# Patient Record
Sex: Male | Born: 1950
Health system: Southern US, Community
[De-identification: ages and names within clinical notes are randomized; demographics above are authoritative.]

## PROBLEM LIST (undated history)

## (undated) DIAGNOSIS — R7989 Other specified abnormal findings of blood chemistry: Secondary | ICD-10-CM

## (undated) DIAGNOSIS — Z86010 Personal history of colonic polyps: Secondary | ICD-10-CM

## (undated) DIAGNOSIS — Z973 Presence of spectacles and contact lenses: Secondary | ICD-10-CM

## (undated) DIAGNOSIS — K635 Polyp of colon: Secondary | ICD-10-CM

## (undated) DIAGNOSIS — M1990 Unspecified osteoarthritis, unspecified site: Secondary | ICD-10-CM

## (undated) DIAGNOSIS — I2510 Atherosclerotic heart disease of native coronary artery without angina pectoris: Secondary | ICD-10-CM

## (undated) DIAGNOSIS — I10 Essential (primary) hypertension: Secondary | ICD-10-CM

## (undated) HISTORY — DX: Essential (primary) hypertension: I10

## (undated) HISTORY — PX: COLONOSCOPY: SHX174

## (undated) HISTORY — DX: Polyp of colon: K63.5

## (undated) HISTORY — DX: Personal history of colonic polyps: Z86.010

---

## 1983-01-14 HISTORY — PX: VARIOCOCELE REPAIR: SHX242

## 2004-04-09 ENCOUNTER — Encounter: Admission: RE | Admit: 2004-04-09 | Discharge: 2004-04-09 | Payer: Self-pay | Admitting: Cardiology

## 2004-05-13 ENCOUNTER — Ambulatory Visit: Payer: Self-pay | Admitting: *Deleted

## 2004-08-13 ENCOUNTER — Ambulatory Visit: Payer: Self-pay | Admitting: Gastroenterology

## 2004-08-13 ENCOUNTER — Encounter (INDEPENDENT_AMBULATORY_CARE_PROVIDER_SITE_OTHER): Payer: Self-pay | Admitting: Specialist

## 2004-08-13 DIAGNOSIS — Z860100 Personal history of colon polyps, unspecified: Secondary | ICD-10-CM

## 2004-08-13 DIAGNOSIS — K449 Diaphragmatic hernia without obstruction or gangrene: Secondary | ICD-10-CM | POA: Insufficient documentation

## 2004-08-13 HISTORY — DX: Personal history of colon polyps, unspecified: Z86.0100

## 2004-08-13 HISTORY — DX: Personal history of colonic polyps: Z86.010

## 2005-03-05 ENCOUNTER — Ambulatory Visit: Payer: Self-pay | Admitting: Cardiology

## 2005-10-24 ENCOUNTER — Ambulatory Visit: Payer: Self-pay | Admitting: Cardiology

## 2005-12-05 ENCOUNTER — Ambulatory Visit: Payer: Self-pay | Admitting: Cardiology

## 2006-01-30 ENCOUNTER — Ambulatory Visit (HOSPITAL_COMMUNITY): Admission: RE | Admit: 2006-01-30 | Discharge: 2006-01-30 | Payer: Self-pay | Admitting: Neurosurgery

## 2006-03-05 ENCOUNTER — Ambulatory Visit: Payer: Self-pay | Admitting: *Deleted

## 2006-03-05 LAB — CONVERTED CEMR LAB: PSA: 2.75 ng/mL (ref 0.10–4.00)

## 2006-09-22 ENCOUNTER — Ambulatory Visit: Payer: Self-pay | Admitting: Cardiology

## 2006-09-22 LAB — CONVERTED CEMR LAB
BUN: 12 mg/dL (ref 6–23)
CO2: 28 meq/L (ref 19–32)
Calcium: 9.3 mg/dL (ref 8.4–10.5)
Chloride: 104 meq/L (ref 96–112)
Cholesterol: 151 mg/dL (ref 0–200)
Creatinine, Ser: 1.1 mg/dL (ref 0.4–1.5)
GFR calc Af Amer: 90 mL/min
GFR calc non Af Amer: 74 mL/min
Glucose, Bld: 88 mg/dL (ref 70–99)
HDL: 40.6 mg/dL (ref >39.0)
LDL Cholesterol: 78 mg/dL (ref 0–99)
PSA: 5.12 ng/mL — ABNORMAL HIGH (ref 0.10–4.00)
Potassium: 3.9 meq/L (ref 3.5–5.1)
Sodium: 138 meq/L (ref 135–145)
Total CHOL/HDL Ratio: 3.7
Triglycerides: 163 mg/dL — ABNORMAL HIGH (ref 0–149)
VLDL: 33 mg/dL (ref 0–40)

## 2006-09-24 ENCOUNTER — Ambulatory Visit: Payer: Self-pay | Admitting: Cardiovascular Disease

## 2006-09-24 ENCOUNTER — Ambulatory Visit (HOSPITAL_COMMUNITY): Admission: RE | Admit: 2006-09-24 | Discharge: 2006-09-24 | Payer: Self-pay | Admitting: Cardiovascular Disease

## 2007-09-01 ENCOUNTER — Telehealth: Payer: Self-pay | Admitting: Internal Medicine

## 2007-09-08 ENCOUNTER — Encounter (INDEPENDENT_AMBULATORY_CARE_PROVIDER_SITE_OTHER): Payer: Self-pay | Admitting: Gastroenterology

## 2007-09-10 ENCOUNTER — Ambulatory Visit: Payer: Self-pay | Admitting: Internal Medicine

## 2007-09-10 DIAGNOSIS — K625 Hemorrhage of anus and rectum: Secondary | ICD-10-CM | POA: Insufficient documentation

## 2007-09-10 DIAGNOSIS — I10 Essential (primary) hypertension: Secondary | ICD-10-CM | POA: Insufficient documentation

## 2007-09-10 DIAGNOSIS — K648 Other hemorrhoids: Secondary | ICD-10-CM | POA: Insufficient documentation

## 2008-01-14 HISTORY — PX: PROSTATE SURGERY: SHX751

## 2008-05-29 ENCOUNTER — Telehealth: Payer: Self-pay | Admitting: Internal Medicine

## 2008-07-19 ENCOUNTER — Encounter (INDEPENDENT_AMBULATORY_CARE_PROVIDER_SITE_OTHER): Payer: Self-pay | Admitting: *Deleted

## 2009-02-02 LAB — HM COLONOSCOPY

## 2009-03-13 ENCOUNTER — Telehealth (INDEPENDENT_AMBULATORY_CARE_PROVIDER_SITE_OTHER): Payer: Self-pay

## 2009-04-27 ENCOUNTER — Ambulatory Visit: Payer: Self-pay | Admitting: Cardiology

## 2009-04-27 DIAGNOSIS — Z87898 Personal history of other specified conditions: Secondary | ICD-10-CM | POA: Insufficient documentation

## 2009-04-27 LAB — CONVERTED CEMR LAB
BUN: 14 mg/dL (ref 6–23)
Basophils Absolute: 0 10*3/uL (ref 0.0–0.1)
Basophils Relative: 1 % (ref 0–1)
CO2: 23 meq/L (ref 19–32)
Calcium: 9.3 mg/dL (ref 8.4–10.5)
Chloride: 103 meq/L (ref 96–112)
Creatinine, Ser: 1 mg/dL (ref 0.40–1.50)
Eosinophils Absolute: 0.1 10*3/uL (ref 0.0–0.7)
Eosinophils Relative: 3 % (ref 0–5)
Glucose, Bld: 91 mg/dL (ref 70–99)
HCT: 40.2 % (ref 39.0–52.0)
Hemoglobin: 13.9 g/dL (ref 13.0–17.0)
Lymphocytes Relative: 51 % — ABNORMAL HIGH (ref 12–46)
Lymphs Abs: 2.6 10*3/uL (ref 0.7–4.0)
MCHC: 34.6 g/dL (ref 30.0–36.0)
MCV: 91 fL (ref 78.0–100.0)
Monocytes Absolute: 0.5 10*3/uL (ref 0.1–1.0)
Monocytes Relative: 10 % (ref 3–12)
Neutro Abs: 1.8 10*3/uL (ref 1.7–7.7)
Neutrophils Relative %: 36 % — ABNORMAL LOW (ref 43–77)
PSA: 4.05 ng/mL — ABNORMAL HIGH (ref 0.10–4.00)
Platelets: 184 10*3/uL (ref 150–400)
Potassium: 4.4 meq/L (ref 3.5–5.3)
RBC: 4.42 M/uL (ref 4.22–5.81)
RDW: 12.5 % (ref 11.5–15.5)
Sodium: 139 meq/L (ref 135–145)
WBC: 5 10*3/uL (ref 4.0–10.5)

## 2009-07-24 ENCOUNTER — Encounter (INDEPENDENT_AMBULATORY_CARE_PROVIDER_SITE_OTHER): Payer: Self-pay

## 2009-07-24 ENCOUNTER — Encounter: Payer: Self-pay | Admitting: Internal Medicine

## 2009-08-07 ENCOUNTER — Ambulatory Visit: Payer: Self-pay | Admitting: Internal Medicine

## 2009-08-09 ENCOUNTER — Encounter: Payer: Self-pay | Admitting: Internal Medicine

## 2010-02-14 NOTE — Progress Notes (Signed)
Summary: triage  Phone Note Call from Patient Call back at C [phone]   Caller: Patient/ Shane Perkins  Call For: Shane Perkins Reason for Call: Talk to Nurse Details for Reason: TRIAGE Summary of Call: Shane Perkins has spoke with Shane Shane Perkins re: bloody diarrhea and needs to come in for an OV pt is on vacation next friday and the  following Mon. can he be worked into sch? Initial call taken by: Guadlupe [facility],  September 01, 2007 2:15 PM  Follow-up for Phone Call        pt requests appt on a Friday due to cath lab schedule 09-10-07 1:30 Follow-up by: Darcey Nora RN,  September 01, 2007 2:37 PM

## 2010-02-14 NOTE — Progress Notes (Signed)
Summary: Schedule recall colon   Phone Note Outgoing Call Call back at [facility] Phone [phone]   Call placed by: Darcey Nora RN, CGRN,  March 13, 2009 2:47 PM Call placed to: Patient Summary of Call: I have left messages for the patient asking him to call me to schedule a colonoscopy if he is interested in scheduling.  I have had no return calls.  A follow up letter will be sent. Initial call taken by: Darcey Nora RN, CGRN,  March 13, 2009 2:48 PM

## 2010-02-14 NOTE — Letter (Signed)
Summary: Recall Colonoscopy Letter  [facility]  [address] [place], [place] [address]   Phone: [phone]  Fax: [phone]      July 19, 2008 MRN: [id number]   Shane Perkins [address] [place], [place]  [address]   Dear Shane Perkins,   According to your medical record, it is time for you to schedule a Colonoscopy. The American Cancer Society recommends this procedure as a method to detect early colon cancer. Patients with a family history of colon cancer, or a personal history of colon polyps or inflammatory bowel disease are at increased risk.  This letter has beeen generated based on the recommendations made at the time of your procedure. If you feel that in your particular situation this may no longer apply, please contact our office.  Please call our office at [phone] to schedule this appointment or to update your records at your earliest convenience.  Thank you for cooperating with [place] to provide you with the very best care possible.   Sincerely,  Iva Boop, M.D.  [facility] [phone]

## 2010-02-14 NOTE — Letter (Signed)
Summary: [facility] Instructions  [facility]  [address] [place], [place] [address]   Phone: [phone]  Fax: [phone]       Shane Perkins    Aug 28, 1950    MRN: [id number]        Procedure Day /Date: Tuesday 08/07/09     Arrival Time: 2:30       Procedure Time: 3:30     Location of Procedure:                    _X _  [facility] (4th Floor)   PREPARATION FOR COLONOSCOPY WITH MOVIPREP   Starting 5 days prior to your procedure 08/02/09 do not eat nuts, seeds, popcorn, corn, beans, peas,  salads, or any raw vegetables.  Do not take any fiber supplements (e.g. Metamucil, Citrucel, and Benefiber).  THE DAY BEFORE YOUR PROCEDURE         DATE: 08/06/09  DAY: Monday  1.  Drink clear liquids the entire day-NO SOLID FOOD  2.  Do not drink anything colored red or purple.  Avoid juices with pulp.  No orange juice.  3.  Drink at least 64 oz. (8 glasses) of fluid/clear liquids during the day to prevent dehydration and help the prep work efficiently.  CLEAR LIQUIDS INCLUDE: Water Jello Ice Popsicles Tea (sugar ok, no milk/cream) Powdered fruit flavored drinks Coffee (sugar ok, no milk/cream) Gatorade Juice: apple, white grape, white cranberry  Lemonade Clear bullion, consomm, broth Carbonated beverages (any kind) Strained chicken noodle soup Hard Candy                             4.  In the morning, mix first dose of MoviPrep solution:    Empty 1 Pouch A and 1 Pouch B into the disposable container    Add lukewarm drinking water to the top line of the container. Mix to dissolve    Refrigerate (mixed solution should be used within 24 hrs)  5.  Begin drinking the prep at 5:00 p.m. The MoviPrep container is divided by 4 marks.   Every 15 minutes drink the solution down to the next mark (approximately 8 oz) until the full liter is complete.   6.  Follow completed prep with 16 oz of clear liquid of your choice (Nothing red or purple).  Continue  to drink clear liquids until bedtime.  7.  Before going to bed, mix second dose of MoviPrep solution:    Empty 1 Pouch A and 1 Pouch B into the disposable container    Add lukewarm drinking water to the top line of the container. Mix to dissolve    Refrigerate  THE DAY OF YOUR PROCEDURE      DATE: 08/07/09 DAY: Tuesday  Beginning at 11:30 a.m. (5 hours before procedure):         1. Every 15 minutes, drink the solution down to the next mark (approx 8 oz) until the full liter is complete.  2. Follow completed prep with 16 oz. of clear liquid of your choice.    3. You may drink clear liquids until 1:30  (2 HOURS BEFORE PROCEDURE).   MEDICATION INSTRUCTIONS  Unless otherwise instructed, you should take regular prescription medications with a small sip of water   as early as possible the morning of your procedure.  Diabetic patients - see separate instructions.  Stop taking Plavix or Aggrenox on  _  _  (  7 days before procedure).     Stop taking Coumadin on  _ _  (5 days before procedure).  Additional medication instructions: _         OTHER INSTRUCTIONS  You will need a responsible adult at least 58 years of age to accompany you and drive you home.   This person must remain in the waiting room during your procedure.  Wear loose fitting clothing that is easily removed.  Leave jewelry and other valuables at home.  However, you may wish to bring a book to read or  an iPod/MP3 player to listen to music as you wait for your procedure to start.  Remove all body piercing jewelry and leave at home.  Total time from sign-in until discharge is approximately 2-3 hours.  You should go home directly after your procedure and rest.  You can resume normal activities the  day after your procedure.  The day of your procedure you should not:   Drive   Make legal decisions   Operate machinery   Drink alcohol   Return to work  You will receive specific instructions about  eating, activities and medications before you leave.    The above instructions have been reviewed and explained to me by   Darcey Nora, RN    I fully understand and can verbalize these instructions _____________________________ Date _________

## 2010-02-14 NOTE — Letter (Signed)
Summary: Patient Notice- Polyp Results  [facility]  [address] [place], [place] [address]   Phone: [phone]  Fax: [phone]        August 09, 2009 MRN: [id number]    Shane Perkins [address] [place], [place]  [address]    Dear Shane Perkins,  As we discussed on the phoone, the polyp removed from your colon was adenomatous. This means that it was pre-cancerous or that  it had the potential to change into cancer over time. The other small polyp was not recovered but based upon size and appearance, was benign.  I recommend that you have a repeat colonoscopy in 5 years to determine if you have developed any new polyps over time. If you develop any new rectal bleeding, abdominal pain or significant bowel habit changes, please contact us before then.  Please call us if you are having persistent problems or have questions about your condition that have not been fully answered at this time.   Sincerely,  Iva Boop MD, [facility]  This letter has been electronically signed by your physician.  Appended Document: Patient Notice- Polyp Results Letter sent 8.1.2011

## 2010-02-14 NOTE — Procedures (Signed)
Summary: ENDO 08-13-2004   EGD  Procedure date:  08/13/2004  Findings:      Location: [facility]     Patient Name: Shane Perkins, Shane Perkins MRN:  Procedure Procedures: Panendoscopy (EGD) CPT: 43235.  Personnel: Endoscopist: Ulyess Mort, MD.  Exam Location: Exam performed in Outpatient Clinic. Outpatient  Patient Consent: Procedure, Alternatives, Risks and Benefits discussed, consent obtained, from patient. Consent was obtained by the RN.  Indications Symptoms: Reflux symptoms  History  Current Medications: Patient is not currently taking Coumadin.  Pre-Exam Physical: Entire physical exam was normal.  Exam Exam Info: Maximum depth of insertion Duodenum, intended Duodenum. Patient position: on left side. Vocal cords visualized. Gastric retroflexion performed. Images were not taken. ASA Classification: II. Tolerance: good.  Sedation Meds: Patient assessed and found to be appropriate for moderate (conscious) sedation. Fentanyl 50 mcg. given IV. Versed 6 mg. given IV. Cetacaine Spray 2 sprays given aerosolized.  Monitoring: BP and pulse monitoring done. Oximetry used. Supplemental O2 given  Findings - HIATAL HERNIA: 2 cms. in length. ICD9: Hernia, Hiatal: 553.3. Comments: mild esophagitis.  - MUCOSAL ABNORMALITY: Fundus to Antrum. Erythematous mucosa. Granular mucosa. Edema present. RUT done, results pending.  - MUCOSAL ABNORMALITY: Pyloric Sphincter to Jejunum. Granular mucosa.   Assessment Abnormal examination, see findings above.  Diagnoses: 553.3: Hernia, Hiatal.   Events  Unplanned Intervention: No unplanned interventions were required.  Unplanned Events: There were no complications. Plans Medication(s): Await pathology. Continue current medications. PPI: Aciphex 20 mg QAM,   Patient Education: Patient given standard instructions for: Hiatal Hernia. Reflux. Mucosal Abnormality.  Disposition: After procedure patient sent to recovery.  After recovery patient sent home.   This report was created from the original endoscopy report, which was reviewed and signed by the above listed endoscopist.

## 2010-02-14 NOTE — Procedures (Signed)
Summary: Colonoscopy  Patient: Shane Perkins Note: All result statuses are Final unless otherwise noted.  Tests: (1) Colonoscopy (COL)   COL Colonoscopy           DONE     [facility]     [address]     [place], [place]  [address]           COLONOSCOPY PROCEDURE REPORT           PATIENT:  Shane, Perkins  MR#:  [id number]     BIRTHDATE:  1950-10-01, 59 yrs. old  GENDER:  male     ENDOSCOPIST:  Iva Boop, MD, [facility]           PROCEDURE DATE:  08/07/2009     PROCEDURE:  Colonoscopy with biopsy and snare polypectomy     ASA CLASS:  Class II     INDICATIONS:  surveillance and high-risk screening 8 mm adenoma     removed 8/06     MEDICATIONS:   Fentanyl 50 mcg IV, Versed 5 mg IV           DESCRIPTION OF PROCEDURE:   After the risks benefits and     alternatives of the procedure were thoroughly explained, informed     consent was obtained.  Digital rectal exam was performed and     revealed no abnormalities and normal prostate.   The LB CF-[id number]     [id number] endoscope was introduced through the anus and advanced to     the cecum, which was identified by both the appendix and ileocecal     valve, without limitations.  The quality of the prep was     excellent, using MoviPrep.  The instrument was then slowly     withdrawn as the colon was fully examined.     Insertion: 8:46 minutes Withdrawal: 12:55 minutes     <<PROCEDUREIMAGES>>           FINDINGS:  Two polyps were found. 3-4 mm transverse colon polyp     and a 2-3 mm right colon polyp. The transverse polyp was snared     without cautery. Retrieval was unsuccessful. The right colon     polyp was removed using cold biopsy forceps.  This was otherwise a     normal examination of the colon.   Retroflexed views in the rectum     revealed no abnormalities.    The scope was then withdrawn from     the patient and the procedure completed.           COMPLICATIONS:  None     ENDOSCOPIC IMPRESSION:     1) Two diminutive  polyps removed, one recovered.     2) Otherwise normal examination, excellent prep     3) Personal history of 8mm adenoma removal 8/206.           REPEAT EXAM:  In for Colonoscopy, pending biopsy results.           Iva Boop, MD, [facility]           CC:  The Patient           n.     eSIGNED:   Iva Boop at 08/07/2009 04:12 PM           Shawnie Pons, [id number]  Note: An exclamation mark (!) indicates a result that was not dispersed into the flowsheet. Document Creation Date: 08/07/2009 4:12 PM _______________________________________________________________________  (1) Order result status:  Final Collection or observation date-time: 08/07/2009 16:02 Requested date-time:  Receipt date-time:  Reported date-time:  Referring Physician:   Ordering Physician: Stan Head [id number]) Specimen Source:  Source: Launa Grill Order Number: [id number] Lab site:   Appended Document: Colonoscopy     Procedures Next Due Date:    Colonoscopy: 07/2014  Appended Document: Colonoscopy     Procedures Next Due Date:    Colonoscopy: 08/2014

## 2010-02-14 NOTE — Miscellaneous (Signed)
Summary: Moviprep  Clinical Lists Changes  Medications: Added new medication of MOVIPREP 100 GM SOLR (PEG-KCL-NACL-NASULF-NA ASC-C) per written instructions - Signed Rx of MOVIPREP 100 GM SOLR (PEG-KCL-NACL-NASULF-NA ASC-C) per written instructions;  #1 x 0;  Signed;  Entered by: Darcey Nora RN, CGRN;  Authorized by: Iva Boop MD, [facility];  Method used: Faxed to [facility], [place] [address]  [place], [place], [place], [place]  [address], Ph: [phone], Fax: [phone]    Prescriptions: MOVIPREP 100 GM SOLR (PEG-KCL-NACL-NASULF-NA ASC-C) per written instructions  #1 x 0   Entered by:   Darcey Nora RN, CGRN   Authorized by:   Iva Boop MD, [facility]   Signed by:   Darcey Nora RN, CGRN on 07/24/2009   Method used:   Faxed to ...       [facility] (retail)       [phone]  [place], [place]  [address]       Ph: [phone]       Fax: [phone]   RxID:   [id number]   Appended Document: Moviprep    Clinical Lists Changes  Medications: Rx of MOVIPREP 100 GM SOLR (PEG-KCL-NACL-NASULF-NA ASC-C) per written instructions;  #1 x 0;  Signed;  Entered by: Darcey Nora RN, CGRN;  Authorized by: Iva Boop MD, [facility];  Method used: Electronically to [facility] [address], [address] Shipping/mailing, [place], [place]  [address], Ph: [phone], Fax: [phone]    Prescriptions: MOVIPREP 100 GM SOLR (PEG-KCL-NACL-NASULF-NA ASC-C) per written instructions  #1 x 0   Entered by:   Darcey Nora RN, CGRN   Authorized by:   Iva Boop MD, [facility]   Signed by:   Darcey Nora RN, CGRN on 07/24/2009   Method used:   Electronically to        [facility]* (retail)       [address]       [address]mailing       [place], [place]  [address]       Ph: [phone]       Fax: [phone]   RxID:   [id number]

## 2010-02-14 NOTE — Procedures (Signed)
Summary: COLON 08-13-04   Colonoscopy  Procedure date:  08/13/2004  Findings:      Location:  [facility].    Procedures Next Due Date:    Colonoscopy: 08/2008    Patient Name: Shane, Perkins MRN:  Procedure Procedures: Colonoscopy CPT: [address].  Personnel: Endoscopist: Ulyess Mort, MD.  Exam Location: Exam performed in Outpatient Clinic. Outpatient  Patient Consent: Procedure, Alternatives, Risks and Benefits discussed, consent obtained, from patient. Consent was obtained by the RN.  Indications  Increased Risk Screening: For family history of colorectal neoplasia, in  uncles Family History of Polyps.  History  Current Medications: Patient is not currently taking Coumadin.  Pre-Exam Physical: Entire physical exam was normal.  Exam Exam: Extent of exam reached: Cecum, extent intended: Cecum.  The cecum was identified by appendiceal orifice and IC valve. Colon retroflexion performed. Images were not taken. ASA Classification: II. Tolerance: excellent.  Monitoring: Pulse and BP monitoring, Oximetry used. Supplemental O2 given.  Colon Prep Prep results: good.  Sedation Meds: Patient assessed and found to be appropriate for moderate (conscious) sedation. Fentanyl 100 mcg. given IV. Versed 10 mg. given IV.  Findings POLYP: Transverse Colon, Maximum size: 8 mm. sessile polyp. Procedure:  snare with cautery, removed, retrieved, Polyp sent to pathology. ICD9: Colon Polyps: 211.3.  - NOT SEEN ON EXAM: Cecum to Rectum. AVM's, Colitis, Crohn's, Diverticulosis,   Assessment Abnormal examination, see findings above.  Diagnoses: 211.3: Colon Polyps.   Events  Unplanned Interventions: No intervention was required.  Unplanned Events: There were no complications. Plans Medication Plan: Await pathology. Continue current medications.  Patient Education: Patient given standard instructions for: Polyps. Yearly hemoccult testing recommended.  Patient instructed to get routine colonoscopy every 4 years.  Disposition: After procedure patient sent to recovery. After recovery patient sent home.   This report was created from the original endoscopy report, which was reviewed and signed by the above listed endoscopist.

## 2010-02-14 NOTE — Assessment & Plan Note (Signed)
Summary: Blood in stool   History of Present Illness Visit Type: self referral Primary GI MD: Stan Head MD [facility] Primary Eaden Hettinger: Willa Rough, M.D. 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 there has been no recuurence and there was no change in bowel habits. He has also checked his stools for hemoccult since and they have been negative on more than one occasion. Prior colonoscopy in 2006 found an 8 mm tubular adenoma that was removed and was otherwise normal. He can fell swelling in the anal area at times and in the past has had some bright red blood on toilet paper after frequent wiping.  Other GI symptoms are heartburn, helped significantly by daily PPI and exacerbated by coffee, wine and bagels.  He also has problems with pelvic pains, mainly aches, and right inguinal pressure and/or pain sensations with ? of a mild bulge there at times. 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 Only rare sciatica, there is some low back pain associated at times. Rest helps relieve these symptoms.  All other GI ROS negative.              Prior Medications Reviewed Using: Patient Recall  Updated Prior Medication List: ATACAND 16 MG TABS (CANDESARTAN CILEXETIL) 1 tablet by mouth once daily COREG 6.25 MG TABS (CARVEDILOL) 1 tablet by mouth once daily OMEPRAZOLE 20 MG CPDR (OMEPRAZOLE) 1 capsule by mouth once daily  Current Allergies (reviewed today): No known allergies   Past Medical History:    Reviewed history from 09/08/2007 and no changes required:       Hypertension       Adenomatous colon polyp 2006       Helicobacter pylori infection (treated 2006)  Past Surgical History:    varicocele repair 1984   Family History:    Family History of Colon Cancer: 2 uncles  paternal    Family History of Colon  Polyps: brother  Social History:    Patient has never smoked.     Alcohol Use - no    Daily Caffeine Use    Married with children    interventional Cardiologist, [facility]   Risk Factors:  Tobacco use:  never Caffeine use:  3 drinks per day Alcohol use:  no   Review of Systems       All other ROS negative except as per HPI.    Vital Signs:  Patient Profile:   60 Years Old Male Height:     71 inches Weight:      204 pounds BMI:     28.56 BSA:     2.13 Pulse rate:   58 / minute BP sitting:   162 / 94  (left arm)  Vitals Entered By: Milford Cage CMA (September 10, 2007 1:47 PM)                  Physical Exam  General:     Well developed, well nourished, no acute distress. Abdomen:     nontender, no masses, no herniae Genitalia:     Normal uncircumcised penia, testicles and scrotum. No herniae. Additional Exam:     ANOSCOPY: small internal hemorrhoids in anal canal    Impression & Recommendations:  Problem # 1:  RECTAL BLEEDING (ICD-569.3) Assessment: Comment Only Self-limited, small hemorrhoid seen on anoscopy,  I think this was likely cause of bleeding. Will monitor for recurrence and plan on routine colonoscopy next year as per Dr. Blossom Hoops prior recommendation for the single 8 mm adenoma he had. if further bleeding or if he were anemic would perform colonoscopy sooner. CBC will be checked via cardiology lab.  Problem # 2:  HYPERTENSION (ICD-401.9) Assessment: Comment Only CMET and lipid panel for health maintenamce, via cardiology lab  Problem # 3:  HEMORRHOIDS, INTERNAL (ICD-455.0) Assessment: Comment Only see #1  Problem # 4:  COLONIC POLYPS, ADENOMATOUS, HX OF (ICD-V12.72) Assessment: Comment Only     ]  Appended Document: Blood in stool Rectal exam with normal tone, brown stool, no mass and normal prostate.

## 2010-02-14 NOTE — Progress Notes (Signed)
Summary: Change in bowels   Phone Note Call from Patient   Caller: Patient Summary of Call: Having some changes in bowels with smaller caliber stools 787.99 hx of adenomatous polyp v12.72 he will call Darcey Nora, RN to arrange for colonoscopy to evaluate change in bowels After that consider further eval of groin/pelvic pain Initial call taken by: Iva Boop MD,  May 29, 2008 9:04 PM  Follow-up for Phone Call        I have sent a flag with availabilities to Dr Riley Kill through EMR.  He will call or flag me back when he is ready to schedule. Follow-up by: Darcey Nora RN,  May 30, 2008 10:12 AM

## 2010-05-06 ENCOUNTER — Other Ambulatory Visit: Payer: Self-pay | Admitting: Cardiology

## 2010-05-06 ENCOUNTER — Other Ambulatory Visit (INDEPENDENT_AMBULATORY_CARE_PROVIDER_SITE_OTHER): Payer: 59 | Admitting: *Deleted

## 2010-05-06 DIAGNOSIS — Z0389 Encounter for observation for other suspected diseases and conditions ruled out: Secondary | ICD-10-CM

## 2010-05-06 DIAGNOSIS — N320 Bladder-neck obstruction: Secondary | ICD-10-CM

## 2010-05-06 DIAGNOSIS — I10 Essential (primary) hypertension: Secondary | ICD-10-CM

## 2010-05-06 LAB — CBC WITH DIFFERENTIAL/PLATELET
Basophils Absolute: 0 10*3/uL (ref 0.0–0.1)
Basophils Relative: 0.5 % (ref 0.0–3.0)
Eosinophils Absolute: 0.1 10*3/uL (ref 0.0–0.7)
Eosinophils Relative: 2.1 % (ref 0.0–5.0)
HCT: 39.5 % (ref 39.0–52.0)
Hemoglobin: 13.9 g/dL (ref 13.0–17.0)
Lymphocytes Relative: 43.2 % (ref 12.0–46.0)
Lymphs Abs: 2.6 10*3/uL (ref 0.7–4.0)
MCHC: 35.2 g/dL (ref 30.0–36.0)
MCV: 95.5 fl (ref 78.0–100.0)
Monocytes Absolute: 0.5 10*3/uL (ref 0.1–1.0)
Monocytes Relative: 8.8 % (ref 3.0–12.0)
Neutro Abs: 2.7 10*3/uL (ref 1.4–7.7)
Neutrophils Relative %: 45.4 % (ref 43.0–77.0)
Platelets: 203 10*3/uL (ref 150.0–400.0)
RBC: 4.14 Mil/uL — ABNORMAL LOW (ref 4.22–5.81)
RDW: 12.4 % (ref 11.5–14.6)
WBC: 6 10*3/uL (ref 4.5–10.5)

## 2010-05-06 LAB — URINALYSIS, ROUTINE W REFLEX MICROSCOPIC
Bilirubin Urine: NEGATIVE
Hgb urine dipstick: NEGATIVE
Ketones, ur: NEGATIVE
Nitrite: NEGATIVE
Specific Gravity, Urine: <=1.005 (ref 1.000–1.030)
Total Protein, Urine: NEGATIVE
Urine Glucose: NEGATIVE
Urobilinogen, UA: 0.2 (ref 0.0–1.0)
pH: 6 (ref 5.0–8.0)

## 2010-05-06 LAB — BASIC METABOLIC PANEL
BUN: 14 mg/dL (ref 6–23)
CO2: 27 mEq/L (ref 19–32)
Calcium: 8.9 mg/dL (ref 8.4–10.5)
Chloride: 94 mEq/L — ABNORMAL LOW (ref 96–112)
Creatinine, Ser: 1 mg/dL (ref 0.4–1.5)
GFR: 81.55 mL/min (ref >60.00)
Glucose, Bld: 86 mg/dL (ref 70–99)
Potassium: 3.8 mEq/L (ref 3.5–5.1)
Sodium: 134 mEq/L — ABNORMAL LOW (ref 135–145)

## 2010-05-06 LAB — HEPATIC FUNCTION PANEL
ALT: 17 U/L (ref 0–53)
AST: 21 U/L (ref 0–37)
Albumin: 4.1 g/dL (ref 3.5–5.2)
Alkaline Phosphatase: 46 U/L (ref 39–117)
Bilirubin, Direct: 0.1 mg/dL (ref 0.0–0.3)
Total Bilirubin: 0.2 mg/dL — ABNORMAL LOW (ref 0.3–1.2)
Total Protein: 6.5 g/dL (ref 6.0–8.3)

## 2010-05-06 LAB — PSA: PSA: 6.98 ng/mL — ABNORMAL HIGH (ref 0.10–4.00)

## 2010-08-30 ENCOUNTER — Other Ambulatory Visit: Payer: Self-pay | Admitting: *Deleted

## 2010-08-30 MED ORDER — COREG 6.25 MG PO TABS: 6.2500 mg | ORAL_TABLET | Freq: Two times a day (BID) | ORAL | Status: DC

## 2010-08-30 NOTE — Telephone Encounter (Signed)
rx sent to [facility] today for Dr.Odette. Danielle Rankin

## 2011-04-24 ENCOUNTER — Other Ambulatory Visit: Payer: Self-pay

## 2011-04-24 ENCOUNTER — Other Ambulatory Visit (INDEPENDENT_AMBULATORY_CARE_PROVIDER_SITE_OTHER): Payer: 59

## 2011-04-24 DIAGNOSIS — N400 Enlarged prostate without lower urinary tract symptoms: Secondary | ICD-10-CM

## 2011-04-24 DIAGNOSIS — I10 Essential (primary) hypertension: Secondary | ICD-10-CM

## 2011-04-25 LAB — BASIC METABOLIC PANEL
BUN: 15 mg/dL (ref 6–23)
CO2: 27 mEq/L (ref 19–32)
Calcium: 9.2 mg/dL (ref 8.4–10.5)
Chloride: 103 mEq/L (ref 96–112)
Creatinine, Ser: 0.9 mg/dL (ref 0.4–1.5)
GFR: 91.79 mL/min (ref >60.00)
Glucose, Bld: 83 mg/dL (ref 70–99)
Potassium: 4.3 mEq/L (ref 3.5–5.1)
Sodium: 138 mEq/L (ref 135–145)

## 2011-04-25 LAB — PSA: PSA: 5.06 ng/mL — ABNORMAL HIGH (ref 0.10–4.00)

## 2011-09-09 ENCOUNTER — Other Ambulatory Visit: Payer: Self-pay | Admitting: Cardiovascular Disease

## 2011-10-16 ENCOUNTER — Other Ambulatory Visit: Payer: Self-pay | Admitting: Dermatology

## 2012-03-12 ENCOUNTER — Other Ambulatory Visit: Payer: Self-pay

## 2012-03-12 MED ORDER — PANTOPRAZOLE SODIUM 40 MG PO TBEC: 40.0000 mg | DELAYED_RELEASE_TABLET | Freq: Every day | ORAL | Status: DC

## 2012-04-08 ENCOUNTER — Other Ambulatory Visit: Payer: Self-pay | Admitting: Internal Medicine

## 2012-04-08 LAB — TSH: TSH: 1.6 u[IU]/mL (ref 0.35–5.50)

## 2012-04-08 LAB — LIPID PANEL
Cholesterol: 157 mg/dL (ref 0–200)
HDL: 38.9 mg/dL — ABNORMAL LOW (ref >39.00)
LDL Cholesterol: 103 mg/dL — ABNORMAL HIGH (ref 0–99)
Total CHOL/HDL Ratio: 4
Triglycerides: 78 mg/dL (ref 0.0–149.0)
VLDL: 15.6 mg/dL (ref 0.0–40.0)

## 2012-04-08 LAB — CBC WITH DIFFERENTIAL/PLATELET
Basophils Absolute: 0 10*3/uL (ref 0.0–0.1)
Basophils Relative: 0.8 % (ref 0.0–3.0)
Eosinophils Absolute: 0.1 10*3/uL (ref 0.0–0.7)
Eosinophils Relative: 1.8 % (ref 0.0–5.0)
HCT: 41.3 % (ref 39.0–52.0)
Hemoglobin: 14.2 g/dL (ref 13.0–17.0)
Lymphocytes Relative: 44.3 % (ref 12.0–46.0)
Lymphs Abs: 1.7 10*3/uL (ref 0.7–4.0)
MCHC: 34.3 g/dL (ref 30.0–36.0)
MCV: 94.4 fl (ref 78.0–100.0)
Monocytes Absolute: 0.4 10*3/uL (ref 0.1–1.0)
Monocytes Relative: 10.3 % (ref 3.0–12.0)
Neutro Abs: 1.7 10*3/uL (ref 1.4–7.7)
Neutrophils Relative %: 42.8 % — ABNORMAL LOW (ref 43.0–77.0)
Platelets: 190 10*3/uL (ref 150.0–400.0)
RBC: 4.38 Mil/uL (ref 4.22–5.81)
RDW: 12.7 % (ref 11.5–14.6)
WBC: 3.9 10*3/uL — ABNORMAL LOW (ref 4.5–10.5)

## 2012-04-08 LAB — COMPREHENSIVE METABOLIC PANEL
ALT: 18 U/L (ref 0–53)
AST: 20 U/L (ref 0–37)
Albumin: 4.2 g/dL (ref 3.5–5.2)
Alkaline Phosphatase: 40 U/L (ref 39–117)
BUN: 17 mg/dL (ref 6–23)
CO2: 25 mEq/L (ref 19–32)
Calcium: 8.9 mg/dL (ref 8.4–10.5)
Chloride: 98 mEq/L (ref 96–112)
Creatinine, Ser: 0.9 mg/dL (ref 0.4–1.5)
GFR: 91.49 mL/min (ref >60.00)
Glucose, Bld: 102 mg/dL — ABNORMAL HIGH (ref 70–99)
Potassium: 4.1 mEq/L (ref 3.5–5.1)
Sodium: 132 mEq/L — ABNORMAL LOW (ref 135–145)
Total Bilirubin: 0.8 mg/dL (ref 0.3–1.2)
Total Protein: 6.7 g/dL (ref 6.0–8.3)

## 2012-04-08 LAB — PSA: PSA: 4.45 ng/mL — ABNORMAL HIGH (ref 0.10–4.00)

## 2012-04-25 ENCOUNTER — Telehealth: Payer: Self-pay | Admitting: Internal Medicine

## 2012-04-29 ENCOUNTER — Other Ambulatory Visit: Payer: Self-pay | Admitting: *Deleted

## 2012-04-29 MED ORDER — CIPROFLOXACIN HCL 500 MG PO TABS: 500.0000 mg | ORAL_TABLET | Freq: Two times a day (BID) | ORAL | Status: DC

## 2012-05-31 ENCOUNTER — Other Ambulatory Visit: Payer: Self-pay

## 2012-05-31 MED ORDER — CANDESARTAN CILEXETIL 32 MG PO TABS: 32.0000 mg | ORAL_TABLET | Freq: Every day | ORAL | Status: DC

## 2012-06-02 ENCOUNTER — Ambulatory Visit (INDEPENDENT_AMBULATORY_CARE_PROVIDER_SITE_OTHER): Payer: 59 | Admitting: Family Medicine

## 2012-06-02 ENCOUNTER — Encounter: Payer: Self-pay | Admitting: Family Medicine

## 2012-06-02 VITALS — BP 110/78 | HR 72 | Temp 98.4°F | Resp 12 | Ht 69.75 in | Wt 203.0 lb

## 2012-06-02 DIAGNOSIS — E291 Testicular hypofunction: Secondary | ICD-10-CM

## 2012-06-02 DIAGNOSIS — I10 Essential (primary) hypertension: Secondary | ICD-10-CM

## 2012-06-02 DIAGNOSIS — R7989 Other specified abnormal findings of blood chemistry: Secondary | ICD-10-CM

## 2012-06-02 DIAGNOSIS — Z87898 Personal history of other specified conditions: Secondary | ICD-10-CM

## 2012-06-02 NOTE — Progress Notes (Signed)
  Subjective:    Patient ID: Shane Perkins, male    DOB: 10-31-1950, 62 y.o.   MRN: [id number]  HPI Patient here to establish care Past medical history reviewed. History of hypertension, elevated PSA with negative biopsies, and low testosterone. He is followed regularly by urology. Medications reviewed. He takes AndroGel and also carvedilol and Atacand. Exercises regularly. No major concerns at this time. Patient's had previous CT calcium score which was 0.  Lipids have been favorable in past.  Nonsmoker. Occasional alcohol use. Cardiologist. Recently retired.  Family history - mother died of MI age 66. Father with history of hypertension.  Past Medical History  Diagnosis Date  . Hypertension    Past Surgical History  Procedure Laterality Date  . Prostate surgery  2010  . Polypectomy  2011    reports that he has never smoked. He does not have any smokeless tobacco history on file. His alcohol and drug histories are not on file. family history includes Heart disease in his mother and Hypertension in his mother. No Known Allergies    Review of Systems  Constitutional: Negative for fatigue.  HENT: Negative for sore throat and trouble swallowing.   Respiratory: Negative for cough, chest tightness and shortness of breath.   Cardiovascular: Negative for chest pain, palpitations and leg swelling.  Gastrointestinal: Negative for abdominal pain.  Musculoskeletal: Negative for arthralgias.  Neurological: Negative for dizziness, syncope, weakness, light-headedness and headaches.       Objective:   Physical Exam  Constitutional: He appears well-developed and well-nourished.  Cardiovascular: Normal rate and regular rhythm.   No murmur heard. Pulmonary/Chest: Effort normal and breath sounds normal. No respiratory distress. He has no wheezes. He has no rales.          Assessment & Plan:  #1 hypertension. Stable on current medication regimen. #2 history of elevated PSA with  negative biopsies. Followed regularly by urology #3 history of low testosterone. On androgen replacement per urology #4 Health maintenance.  Consider CPE at some point within the next year.

## 2012-08-06 ENCOUNTER — Encounter (INDEPENDENT_AMBULATORY_CARE_PROVIDER_SITE_OTHER): Payer: 59

## 2012-08-06 ENCOUNTER — Encounter: Payer: Self-pay | Admitting: *Deleted

## 2012-08-06 DIAGNOSIS — M79609 Pain in unspecified limb: Secondary | ICD-10-CM

## 2012-08-06 DIAGNOSIS — M7989 Other specified soft tissue disorders: Secondary | ICD-10-CM

## 2012-08-06 NOTE — Telephone Encounter (Signed)
This encounter was created in error - please disregard.

## 2012-09-09 ENCOUNTER — Encounter: Payer: Self-pay | Admitting: Family Medicine

## 2012-11-25 ENCOUNTER — Other Ambulatory Visit: Payer: Self-pay

## 2012-11-25 MED ORDER — CARVEDILOL 6.25 MG PO TABS: ORAL_TABLET | ORAL | Status: DC

## 2013-03-23 ENCOUNTER — Encounter: Payer: Self-pay | Admitting: Cardiology

## 2013-03-29 ENCOUNTER — Other Ambulatory Visit (HOSPITAL_COMMUNITY): Payer: Self-pay | Admitting: Sports Medicine

## 2013-03-29 DIAGNOSIS — M25561 Pain in right knee: Secondary | ICD-10-CM

## 2013-04-02 ENCOUNTER — Ambulatory Visit (HOSPITAL_BASED_OUTPATIENT_CLINIC_OR_DEPARTMENT_OTHER)
Admission: RE | Admit: 2013-04-02 | Discharge: 2013-04-02 | Disposition: A | Discharge status: Home / Self Care | Payer: 59 | Source: Ambulatory Visit | Attending: Sports Medicine | Admitting: Sports Medicine

## 2013-04-02 DIAGNOSIS — M79609 Pain in unspecified limb: Secondary | ICD-10-CM | POA: Insufficient documentation

## 2013-04-02 DIAGNOSIS — M25561 Pain in right knee: Secondary | ICD-10-CM

## 2013-06-20 ENCOUNTER — Telehealth: Payer: Self-pay | Admitting: Family Medicine

## 2013-06-20 MED ORDER — CANDESARTAN CILEXETIL 32 MG PO TABS: 32.0000 mg | ORAL_TABLET | Freq: Every day | ORAL | Status: DC

## 2013-06-20 MED ORDER — PANTOPRAZOLE SODIUM 40 MG PO TBEC: 40.0000 mg | DELAYED_RELEASE_TABLET | Freq: Every day | ORAL | Status: DC

## 2013-06-20 NOTE — Telephone Encounter (Signed)
And re-fill on candesartan (ATACAND) 32 MG tablet

## 2013-06-20 NOTE — Telephone Encounter (Signed)
RX sent to pharmacy  

## 2013-06-20 NOTE — Telephone Encounter (Signed)
[facility] - [place], [place] - [address]. Is requesting re-fill on pantoprazole (PROTONIX) 40 MG tablet

## 2013-07-11 ENCOUNTER — Ambulatory Visit (INDEPENDENT_AMBULATORY_CARE_PROVIDER_SITE_OTHER): Payer: 59 | Admitting: Family Medicine

## 2013-07-11 ENCOUNTER — Encounter: Payer: Self-pay | Admitting: Family Medicine

## 2013-07-11 VITALS — BP 138/80 | HR 62 | Temp 97.5°F | Wt 201.0 lb

## 2013-07-11 DIAGNOSIS — Z23 Encounter for immunization: Secondary | ICD-10-CM

## 2013-07-11 DIAGNOSIS — Z0000 Encounter for general adult medical examination without abnormal findings: Secondary | ICD-10-CM

## 2013-07-11 DIAGNOSIS — Z2911 Encounter for prophylactic immunotherapy for respiratory syncytial virus (RSV): Secondary | ICD-10-CM

## 2013-07-11 LAB — BASIC METABOLIC PANEL
BUN: 16 mg/dL (ref 6–23)
CO2: 28 mEq/L (ref 19–32)
Calcium: 9.4 mg/dL (ref 8.4–10.5)
Chloride: 102 mEq/L (ref 96–112)
Creatinine, Ser: 1 mg/dL (ref 0.4–1.5)
GFR: 80.67 mL/min (ref >60.00)
Glucose, Bld: 97 mg/dL (ref 70–99)
Potassium: 4.6 mEq/L (ref 3.5–5.1)
Sodium: 138 mEq/L (ref 135–145)

## 2013-07-11 LAB — CBC WITH DIFFERENTIAL/PLATELET
Basophils Absolute: 0 10*3/uL (ref 0.0–0.1)
Basophils Relative: 0.6 % (ref 0.0–3.0)
Eosinophils Absolute: 0.2 10*3/uL (ref 0.0–0.7)
Eosinophils Relative: 5.4 % — ABNORMAL HIGH (ref 0.0–5.0)
HCT: 42.4 % (ref 39.0–52.0)
Hemoglobin: 14.4 g/dL (ref 13.0–17.0)
Lymphocytes Relative: 47.2 % — ABNORMAL HIGH (ref 12.0–46.0)
Lymphs Abs: 2.1 10*3/uL (ref 0.7–4.0)
MCHC: 33.8 g/dL (ref 30.0–36.0)
MCV: 95.4 fl (ref 78.0–100.0)
Monocytes Absolute: 0.4 10*3/uL (ref 0.1–1.0)
Monocytes Relative: 9.6 % (ref 3.0–12.0)
Neutro Abs: 1.7 10*3/uL (ref 1.4–7.7)
Neutrophils Relative %: 37.2 % — ABNORMAL LOW (ref 43.0–77.0)
Platelets: 194 10*3/uL (ref 150.0–400.0)
RBC: 4.44 Mil/uL (ref 4.22–5.81)
RDW: 13.4 % (ref 11.5–15.5)
WBC: 4.5 10*3/uL (ref 4.0–10.5)

## 2013-07-11 LAB — HEPATIC FUNCTION PANEL
ALT: 22 U/L (ref 0–53)
AST: 25 U/L (ref 0–37)
Albumin: 4.5 g/dL (ref 3.5–5.2)
Alkaline Phosphatase: 45 U/L (ref 39–117)
Bilirubin, Direct: 0.1 mg/dL (ref 0.0–0.3)
Total Bilirubin: 0.9 mg/dL (ref 0.2–1.2)
Total Protein: 7.3 g/dL (ref 6.0–8.3)

## 2013-07-11 LAB — PSA: PSA: 4.74 ng/mL — ABNORMAL HIGH (ref 0.10–4.00)

## 2013-07-11 LAB — TSH: TSH: 2.12 u[IU]/mL (ref 0.35–4.50)

## 2013-07-11 LAB — VITAMIN B12: Vitamin B-12: 385 pg/mL (ref 211–911)

## 2013-07-11 MED ORDER — TADALAFIL 20 MG PO TABS: 20.0000 mg | ORAL_TABLET | ORAL | Status: DC | PRN

## 2013-07-11 MED ORDER — CANDESARTAN CILEXETIL 32 MG PO TABS
32.0000 mg | ORAL_TABLET | Freq: Every day | ORAL | Status: DC
Start: 2013-07-11 — End: 2014-07-06

## 2013-07-11 MED ORDER — TESTOSTERONE 40.5 MG/2.5GM (1.62%) TD GEL: TRANSDERMAL | Status: DC

## 2013-07-11 MED ORDER — PANTOPRAZOLE SODIUM 40 MG PO TBEC
40.0000 mg | DELAYED_RELEASE_TABLET | Freq: Every day | ORAL | Status: DC
Start: 2013-07-11 — End: 2014-07-06

## 2013-07-11 MED ORDER — CARVEDILOL 6.25 MG PO TABS: ORAL_TABLET | ORAL | Status: DC

## 2013-07-11 NOTE — Progress Notes (Signed)
   Subjective:    Patient ID: Shane Perkins, male    DOB: August 23, 1950, 63 y.o.   MRN: [id number]  HPI Here for complete physical. He has history of hypertension treated with Atacand and carvedilol. Blood pressures have been stable. No history of CAD or peripheral vascular disease. History of GERD controlled with Protonix. Chronic elevated PSA with previous negative biopsies. Followed by urology in the past. Last PSA over 6 months ago. Patient states he had CBC per urology back in December with elevated MCV 101. Does take chronic proton pump inhibitor. No neuropathy symptoms.  Previous CT angiogram calcium score 0. Does have positive family history of CAD. Exercises regularly without difficulty.  Never smoked.  Last tetanus estimated 10 years or more. No history of shingles vaccine.  Past Medical History  Diagnosis Date  . Hypertension    Past Surgical History  Procedure Laterality Date  . Prostate surgery  2010  . Polypectomy  2011    reports that he has never smoked. He does not have any smokeless tobacco history on file. His alcohol and drug histories are not on file. family history includes Heart disease in his mother; Hypertension in his mother. No Known Allergies    Review of Systems  Constitutional: Negative for fever, activity change, appetite change, fatigue and unexpected weight change.  HENT: Negative for congestion, ear pain and trouble swallowing.   Eyes: Negative for pain and visual disturbance.  Respiratory: Negative for cough, shortness of breath and wheezing.   Cardiovascular: Negative for chest pain and palpitations.  Gastrointestinal: Negative for nausea, vomiting, abdominal pain, diarrhea, constipation, blood in stool, abdominal distention and rectal pain.  Endocrine: Negative for polydipsia and polyuria.  Genitourinary: Negative for dysuria, hematuria and testicular pain.  Musculoskeletal: Negative for arthralgias and joint swelling.  Skin: Negative for rash.    Neurological: Negative for dizziness, syncope and headaches.  Hematological: Negative for adenopathy.  Psychiatric/Behavioral: Negative for confusion and dysphoric mood.       Objective:   Physical Exam  Constitutional: He is oriented to person, place, and time. He appears well-developed and well-nourished. No distress.  HENT:  Head: Normocephalic and atraumatic.  Right Ear: External ear normal.  Left Ear: External ear normal.  Mouth/Throat: Oropharynx is clear and moist.  Eyes: Conjunctivae and EOM are normal. Pupils are equal, round, and reactive to light.  Neck: Normal range of motion. Neck supple. No thyromegaly present.  Cardiovascular: Normal rate, regular rhythm and normal heart sounds.   No murmur heard. Pulmonary/Chest: No respiratory distress. He has no wheezes. He has no rales.  Abdominal: Soft. Bowel sounds are normal. He exhibits no distension and no mass. There is no tenderness. There is no rebound and no guarding.  Genitourinary:  Per urology  Musculoskeletal: He exhibits no edema.  Lymphadenopathy:    He has no cervical adenopathy.  Neurological: He is alert and oriented to person, place, and time. He displays normal reflexes. No cranial nerve deficit.  Skin: No rash noted.  Psychiatric: He has a normal mood and affect.          Assessment & Plan:  Complete physical. Shingles vaccine and tetanus given.  Check labs with PSA, CBC, B12, TSH, basic metabolic panel. He declines lipid panel. Colonoscopy up-to-date. Refilled medications for one year

## 2013-07-11 NOTE — Progress Notes (Signed)
Pre visit review using our clinic review tool, if applicable. No additional management support is needed unless otherwise documented below in the visit note. 

## 2013-08-08 ENCOUNTER — Other Ambulatory Visit: Payer: Self-pay

## 2013-08-08 ENCOUNTER — Telehealth: Payer: Self-pay | Admitting: Family Medicine

## 2013-08-08 ENCOUNTER — Ambulatory Visit (INDEPENDENT_AMBULATORY_CARE_PROVIDER_SITE_OTHER): Payer: 59 | Admitting: Family Medicine

## 2013-08-08 ENCOUNTER — Ambulatory Visit (HOSPITAL_COMMUNITY): Payer: 59 | Attending: Family Medicine | Admitting: Cardiology

## 2013-08-08 VITALS — BP 130/70 | HR 70 | Wt 200.0 lb

## 2013-08-08 DIAGNOSIS — R600 Localized edema: Secondary | ICD-10-CM

## 2013-08-08 DIAGNOSIS — M7989 Other specified soft tissue disorders: Secondary | ICD-10-CM | POA: Insufficient documentation

## 2013-08-08 DIAGNOSIS — M79604 Pain in right leg: Secondary | ICD-10-CM

## 2013-08-08 DIAGNOSIS — R609 Edema, unspecified: Secondary | ICD-10-CM

## 2013-08-08 DIAGNOSIS — I10 Essential (primary) hypertension: Secondary | ICD-10-CM | POA: Insufficient documentation

## 2013-08-08 DIAGNOSIS — L539 Erythematous condition, unspecified: Secondary | ICD-10-CM

## 2013-08-08 DIAGNOSIS — M79609 Pain in unspecified limb: Secondary | ICD-10-CM | POA: Insufficient documentation

## 2013-08-08 DIAGNOSIS — M6740 Ganglion, unspecified site: Secondary | ICD-10-CM

## 2013-08-08 DIAGNOSIS — R229 Localized swelling, mass and lump, unspecified: Secondary | ICD-10-CM | POA: Insufficient documentation

## 2013-08-08 NOTE — Progress Notes (Signed)
Pre visit review using our clinic review tool, if applicable. No additional management support is needed unless otherwise documented below in the visit note. 

## 2013-08-08 NOTE — Telephone Encounter (Signed)
Noted  

## 2013-08-08 NOTE — Progress Notes (Signed)
Right LE venous duplex performed.  Preliminary report in Epic

## 2013-08-08 NOTE — Progress Notes (Signed)
   Subjective:    Patient ID: Shane Perkins, male    DOB: [birth year], 63 y.o.   MRN: [id number]  Leg Pain    Right lower extremity swelling for the past 3 days. Patient had extensive travels recently with several hours in a vehicle. No prior history of DVT. He actually went for venous Doppler this morning which did not reveal any evidence for DVT.  He gives history remotely in middle school football injury with large hematoma right calf. Had some tenderness and mild asymmetric edema since then. About a year ago he had similar occurrence and treated for possible cellulitis. He has had some recent right knee issues and saw orthopedist. In March she had an MRI which showed medial and lateral meniscal tears which were small and ganglion cyst head of gastrocnemius.  Last night, he took one dose of Eliquis pending Doppler results. He does have prior history of small Baker's cyst on recent MRI scan. He has not noted any redness or warmth or any fevers or chills to suggest cellulitis   Review of Systems  Constitutional: Negative for fever and chills.  Respiratory: Negative for shortness of breath.   Cardiovascular: Negative for chest pain.       Objective:   Physical Exam  Constitutional: He appears well-developed and well-nourished.  Cardiovascular: Normal rate and regular rhythm.   Pulmonary/Chest: Effort normal and breath sounds normal. No respiratory distress. He has no wheezes. He has no rales.  Musculoskeletal:  Has some nonpitting asymmetric edema right lower extremity compared to left. He has good distal pulses and no increased warmth or erythema. He has tenderness involving the medial head gastrocnemius muscle. He has some pain with dorsiflexion but not plantar flexion He does have some soft tissue edema proximal right calf muscle. No knee effusion          Assessment & Plan:  Asymmetric right leg edema. Suspect related to ganglion cyst which has been noted on recent MRI scan  and Doppler. No evidence for recurrent DVT by Doppler earlier today. No evidence for cellulitis changes. We've recommended elevation and observe for now. Consider orthopedic referral if edema/pain persists

## 2013-08-08 NOTE — Telephone Encounter (Signed)
Patient Information:  Caller Name: Champ  Phone: [phone]  Patient: Shane Perkins, Shane Perkins  Gender: Male  DOB: April 26, 1950  Age: 63 Years  PCP: Carolann Littler [facility])  Office Follow Up:  Does the office need to follow up with this patient?: No  Instructions For The Office: N/A  RN Note:  Pt fully aware of call back and or ED parameters. Booked into appt today with his PCP, Dr. Elease Hashimoto. Pt also states he is working on getting a doppler done today via his office.   Symptoms  Reason For Call & Symptoms: RLE swelling (mild-mod). 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 Also has hx of cellulitis/MRSA treatment in past and states it could be that as well. He called overnight and was triaged and triage RN spoke to MD oncall (Dr. Silvio Pate) who advised him to call for appt today and who started him on abx in case of infection. Pt also took a dose of Eliquis that he had at home in case of clot. Denies any severe swelling, any SOB, any signs of lack of circulation, etc...  Reviewed Health History In EMR: Yes  Reviewed Medications In EMR: Yes  Reviewed Allergies In EMR: Yes  Reviewed Surgeries / Procedures: Yes  Date of Onset of Symptoms: 08/07/2013  Guideline(s) Used:  Leg Swelling and Edema  Disposition Per Guideline:   Go to ED Now (or to Office with PCP Approval)  Reason For Disposition Reached:   Thigh, calf, or ankle swelling in only one leg  Advice Given:  Call Back If:  You become worse.  Patient Will Follow Care Advice:  YES  Appointment Scheduled:  08/08/2013 13:15:00 Appointment Scheduled Provider:  Carolann Littler [facility])

## 2013-10-20 ENCOUNTER — Telehealth: Payer: Self-pay | Admitting: Family Medicine

## 2013-10-20 NOTE — Telephone Encounter (Signed)
I received a PA request for Androgel but there aren't any supporting testosterone lab results.  The form is asking for 2 total or free testosterone levels.  Please advise

## 2013-10-20 NOTE — Telephone Encounter (Signed)
These were obtained by urologist ( who started him on replacement)

## 2013-10-25 NOTE — Telephone Encounter (Signed)
PSA results are in patient chart.

## 2013-10-25 NOTE — Telephone Encounter (Signed)
Yes

## 2013-10-25 NOTE — Telephone Encounter (Signed)
The PA request is asking for 2 testosterone levels

## 2013-10-25 NOTE — Telephone Encounter (Signed)
I spoke to Dr. Shann Medal office and was advised that the pt was suppose to have a 6 month rov with their office but the pt never scheduled it, it was due in June.  I was told that they will fill the medication but the pt needs to make his appointment with them and continue his office visits.  Can I call and advise the pt?

## 2013-10-25 NOTE — Telephone Encounter (Signed)
Can the pt have the RX filled by their urologist??  If I submit the PA without the results, it will cause a denial.

## 2013-10-26 NOTE — Telephone Encounter (Signed)
Spoke with patient [place] is in another state and will bring in lab results so that we can send in [place].

## 2013-10-26 NOTE — Telephone Encounter (Signed)
Is there anyway that we can get his results sent over here to the office.

## 2013-10-26 NOTE — Telephone Encounter (Signed)
We are happy to take over prescribing .  I would leave this up to patient's preference.  As long as we have 2 testosterone levels in chart and can get authorized, OK to proceed with that.

## 2013-10-26 NOTE — Telephone Encounter (Signed)
He would need to sign a medical release form with Ms. Joycelyn Schmid and he has to have a lab drawn within the last 6 months as part of the 2 draws.

## 2013-11-23 ENCOUNTER — Encounter: Payer: Self-pay | Admitting: Family Medicine

## 2013-11-23 NOTE — Telephone Encounter (Signed)
I received the lab results and sent the PA request to [place] on 11/08/13. Unfortunately their PA process isn't very fast.

## 2013-11-24 NOTE — Telephone Encounter (Signed)
I called to check the status of the PA request and was advised it is still pending.   The representative told me once a decision has been made, the office will receive a fax back.

## 2013-11-30 NOTE — Telephone Encounter (Signed)
I called [place] to check status of the PA request and was advised it is still pending. The representative advised me she will sent a note to the pharmacy review team because a decision should have been made by now.

## 2013-12-11 ENCOUNTER — Encounter: Payer: Self-pay | Admitting: Family Medicine

## 2014-01-16 NOTE — Telephone Encounter (Signed)
I never submitted an Appeal for patient's medication since I was not advised to do so.  I called customer service and was advised he would send me a form to complete for the Appeal.  I received the form and it was the same as the PA request submitted.  I went ahead an faxed the letter printed to [place], to the Attn: Appeals Dept.

## 2014-01-24 ENCOUNTER — Telehealth: Payer: Self-pay | Admitting: Family Medicine

## 2014-01-24 MED ORDER — TESTOSTERONE 40.5 MG/2.5GM (1.62%) TD GEL: TRANSDERMAL | Status: DC

## 2014-01-24 NOTE — Telephone Encounter (Signed)
Please let him know this was approved.

## 2014-01-24 NOTE — Telephone Encounter (Signed)
Pt aware.

## 2014-01-24 NOTE — Telephone Encounter (Signed)
The Appeal for Andro Gel was approved.  Approval dates are 01/16/14-01/17/15.

## 2014-03-14 ENCOUNTER — Encounter (HOSPITAL_BASED_OUTPATIENT_CLINIC_OR_DEPARTMENT_OTHER): Payer: Self-pay | Admitting: *Deleted

## 2014-03-14 NOTE — Progress Notes (Signed)
To come in for [id number] 

## 2014-03-15 ENCOUNTER — Encounter (HOSPITAL_BASED_OUTPATIENT_CLINIC_OR_DEPARTMENT_OTHER)
Admission: RE | Admit: 2014-03-15 | Discharge: 2014-03-15 | Disposition: A | Discharge status: Home / Self Care | Payer: 59 | Source: Ambulatory Visit | Attending: Orthopedic Surgery | Admitting: Orthopedic Surgery

## 2014-03-15 DIAGNOSIS — Z86010 Personal history of colonic polyps: Secondary | ICD-10-CM | POA: Diagnosis not present

## 2014-03-15 DIAGNOSIS — M7741 Metatarsalgia, right foot: Secondary | ICD-10-CM | POA: Diagnosis not present

## 2014-03-15 DIAGNOSIS — I10 Essential (primary) hypertension: Secondary | ICD-10-CM | POA: Diagnosis not present

## 2014-03-15 DIAGNOSIS — M205X1 Other deformities of toe(s) (acquired), right foot: Secondary | ICD-10-CM | POA: Diagnosis not present

## 2014-03-15 DIAGNOSIS — M2041 Other hammer toe(s) (acquired), right foot: Secondary | ICD-10-CM | POA: Diagnosis not present

## 2014-03-15 LAB — BASIC METABOLIC PANEL
Anion gap: 7 (ref 5–15)
BUN: 13 mg/dL (ref 6–23)
CO2: 27 mmol/L (ref 19–32)
Calcium: 9.1 mg/dL (ref 8.4–10.5)
Chloride: 100 mmol/L (ref 96–112)
Creatinine, Ser: 1.04 mg/dL (ref 0.50–1.35)
GFR calc Af Amer: 88 mL/min — ABNORMAL LOW (ref >90)
GFR calc non Af Amer: 76 mL/min — ABNORMAL LOW (ref >90)
Glucose, Bld: 111 mg/dL — ABNORMAL HIGH (ref 70–99)
Potassium: 4.7 mmol/L (ref 3.5–5.1)
Sodium: 134 mmol/L — ABNORMAL LOW (ref 135–145)

## 2014-03-16 ENCOUNTER — Ambulatory Visit (HOSPITAL_BASED_OUTPATIENT_CLINIC_OR_DEPARTMENT_OTHER): Payer: 59 | Admitting: Certified Registered"

## 2014-03-16 ENCOUNTER — Encounter (HOSPITAL_BASED_OUTPATIENT_CLINIC_OR_DEPARTMENT_OTHER)
Admission: RE | Disposition: A | Discharge status: Home / Self Care | Payer: Self-pay | Source: Ambulatory Visit | Attending: Orthopedic Surgery

## 2014-03-16 ENCOUNTER — Encounter (HOSPITAL_BASED_OUTPATIENT_CLINIC_OR_DEPARTMENT_OTHER): Payer: Self-pay | Admitting: Certified Registered"

## 2014-03-16 ENCOUNTER — Ambulatory Visit (HOSPITAL_BASED_OUTPATIENT_CLINIC_OR_DEPARTMENT_OTHER)
Admission: RE | Admit: 2014-03-16 | Discharge: 2014-03-16 | Disposition: A | Discharge status: Home / Self Care | Payer: 59 | Source: Ambulatory Visit | Attending: Orthopedic Surgery | Admitting: Orthopedic Surgery

## 2014-03-16 DIAGNOSIS — M7741 Metatarsalgia, right foot: Secondary | ICD-10-CM | POA: Insufficient documentation

## 2014-03-16 DIAGNOSIS — M205X1 Other deformities of toe(s) (acquired), right foot: Secondary | ICD-10-CM | POA: Insufficient documentation

## 2014-03-16 DIAGNOSIS — M2041 Other hammer toe(s) (acquired), right foot: Secondary | ICD-10-CM | POA: Insufficient documentation

## 2014-03-16 DIAGNOSIS — Z86010 Personal history of colonic polyps: Secondary | ICD-10-CM | POA: Insufficient documentation

## 2014-03-16 DIAGNOSIS — I10 Essential (primary) hypertension: Secondary | ICD-10-CM | POA: Insufficient documentation

## 2014-03-16 HISTORY — DX: Presence of spectacles and contact lenses: Z97.3

## 2014-03-16 HISTORY — DX: Other specified abnormal findings of blood chemistry: R79.89

## 2014-03-16 HISTORY — PX: HAMMERTOE RECONSTRUCTION WITH WEIL OSTEOTOMY: SHX5631

## 2014-03-16 LAB — BASIC METABOLIC PANEL
Creatinine: 1.1 mg/dL (ref 0.6–1.3)
Glucose: 128 mg/dL
Potassium: 3.8 mmol/L (ref 3.4–5.3)
Sodium: 134 mmol/L — AB (ref 137–147)

## 2014-03-16 LAB — HEPATIC FUNCTION PANEL
ALT: 19 U/L (ref 10–40)
AST: 24 U/L (ref 14–40)
Alkaline Phosphatase: 54 U/L (ref 25–125)
Bilirubin, Total: 0.4 mg/dL

## 2014-03-16 LAB — CBC AND DIFFERENTIAL
HCT: 42 % (ref 41–53)
Hemoglobin: 14.6 g/dL (ref 13.5–17.5)
Platelets: 206 10*3/uL (ref 150–399)
WBC: 5.3 10^3/mL

## 2014-03-16 LAB — PSA: PSA: 4.89

## 2014-03-16 LAB — LIPID PANEL
Cholesterol: 149 mg/dL (ref 0–200)
HDL: 40 mg/dL (ref 35–70)
LDL Cholesterol: 74 mg/dL
Triglycerides: 177 mg/dL — AB (ref 40–160)

## 2014-03-16 LAB — POCT HEMOGLOBIN-HEMACUE: Hemoglobin: 15.1 g/dL (ref 13.0–17.0)

## 2014-03-16 SURGERY — HAMMERTOE RECONSTRUCTION WITH WEIL OSTEOTOMY
Anesthesia: Regional | Site: Toe | Laterality: Right

## 2014-03-16 MED ORDER — OXYCODONE HCL 5 MG/5ML PO SOLN: 5.0000 mg | Freq: Once | ORAL | Status: DC | PRN

## 2014-03-16 MED ORDER — LACTATED RINGERS IV SOLN
INTRAVENOUS | Status: DC
  Administered 2014-03-16 (×2): via INTRAVENOUS

## 2014-03-16 MED ORDER — EPHEDRINE SULFATE 50 MG/ML IJ SOLN
INTRAMUSCULAR | Status: DC | PRN
  Administered 2014-03-16 (×2): 10 mg via INTRAVENOUS

## 2014-03-16 MED ORDER — BACITRACIN ZINC 500 UNIT/GM EX OINT
TOPICAL_OINTMENT | CUTANEOUS | Status: AC
  Filled 2014-03-16: qty 28.35

## 2014-03-16 MED ORDER — 0.9 % SODIUM CHLORIDE (POUR BTL) OPTIME
TOPICAL | Status: DC | PRN
  Administered 2014-03-16: 200 mL

## 2014-03-16 MED ORDER — HYDROMORPHONE HCL 1 MG/ML IJ SOLN: 0.2500 mg | INTRAMUSCULAR | Status: DC | PRN

## 2014-03-16 MED ORDER — PROMETHAZINE HCL 25 MG/ML IJ SOLN: 6.2500 mg | INTRAMUSCULAR | Status: DC | PRN

## 2014-03-16 MED ORDER — MIDAZOLAM HCL 2 MG/2ML IJ SOLN
INTRAMUSCULAR | Status: AC
  Filled 2014-03-16: qty 2

## 2014-03-16 MED ORDER — CEFAZOLIN SODIUM-DEXTROSE 2-3 GM-% IV SOLR
2.0000 g | INTRAVENOUS | Status: AC
  Administered 2014-03-16: 2 g via INTRAVENOUS

## 2014-03-16 MED ORDER — CHLORHEXIDINE GLUCONATE 4 % EX LIQD: 60.0000 mL | Freq: Once | CUTANEOUS | Status: DC

## 2014-03-16 MED ORDER — CEFAZOLIN SODIUM-DEXTROSE 2-3 GM-% IV SOLR
INTRAVENOUS | Status: AC
  Filled 2014-03-16: qty 50

## 2014-03-16 MED ORDER — PROPOFOL 10 MG/ML IV BOLUS
INTRAVENOUS | Status: DC | PRN
  Administered 2014-03-16: 150 mg via INTRAVENOUS

## 2014-03-16 MED ORDER — ONDANSETRON HCL 4 MG/2ML IJ SOLN
INTRAMUSCULAR | Status: DC | PRN
  Administered 2014-03-16: 4 mg via INTRAVENOUS

## 2014-03-16 MED ORDER — BUPIVACAINE-EPINEPHRINE (PF) 0.5% -1:200000 IJ SOLN
INTRAMUSCULAR | Status: DC | PRN
  Administered 2014-03-16: 30 mL via PERINEURAL

## 2014-03-16 MED ORDER — FENTANYL CITRATE 0.05 MG/ML IJ SOLN
INTRAMUSCULAR | Status: AC
  Filled 2014-03-16: qty 2

## 2014-03-16 MED ORDER — FENTANYL CITRATE 0.05 MG/ML IJ SOLN
INTRAMUSCULAR | Status: AC
  Filled 2014-03-16: qty 6

## 2014-03-16 MED ORDER — OXYCODONE HCL 5 MG PO TABS: 5.0000 mg | ORAL_TABLET | Freq: Once | ORAL | Status: DC | PRN

## 2014-03-16 MED ORDER — MIDAZOLAM HCL 2 MG/2ML IJ SOLN
1.0000 mg | INTRAMUSCULAR | Status: DC | PRN
Start: 2014-03-16 — End: 2014-03-16
  Administered 2014-03-16: 2 mg via INTRAVENOUS

## 2014-03-16 MED ORDER — SODIUM CHLORIDE 0.9 % IV SOLN: INTRAVENOUS | Status: DC

## 2014-03-16 MED ORDER — MIDAZOLAM HCL 5 MG/5ML IJ SOLN
INTRAMUSCULAR | Status: DC | PRN
  Administered 2014-03-16: 1 mg via INTRAVENOUS

## 2014-03-16 MED ORDER — FENTANYL CITRATE 0.05 MG/ML IJ SOLN
50.0000 ug | INTRAMUSCULAR | Status: DC | PRN
  Administered 2014-03-16: 100 ug via INTRAVENOUS

## 2014-03-16 MED ORDER — BUPIVACAINE-EPINEPHRINE (PF) 0.5% -1:200000 IJ SOLN
INTRAMUSCULAR | Status: AC
  Filled 2014-03-16: qty 30

## 2014-03-16 MED ORDER — HYDROCODONE-ACETAMINOPHEN 5-325 MG PO TABS: 1.0000 | ORAL_TABLET | Freq: Four times a day (QID) | ORAL | Status: DC | PRN

## 2014-03-16 MED ORDER — DEXAMETHASONE SODIUM PHOSPHATE 10 MG/ML IJ SOLN
INTRAMUSCULAR | Status: DC | PRN
  Administered 2014-03-16: 10 mg via INTRAVENOUS

## 2014-03-16 MED ORDER — PROPOFOL 10 MG/ML IV EMUL
INTRAVENOUS | Status: AC
  Filled 2014-03-16: qty 50

## 2014-03-16 MED ORDER — BACITRACIN ZINC 500 UNIT/GM EX OINT
TOPICAL_OINTMENT | CUTANEOUS | Status: DC | PRN
  Administered 2014-03-16: 1 via TOPICAL

## 2014-03-16 SURGICAL SUPPLY — 68 items
BANDAGE ESMARK 6X9 LF (GAUZE/BANDAGES/DRESSINGS) IMPLANT
BLADE AVERAGE 25X9 (BLADE) ×3 IMPLANT
BLADE OSC/SAG .038X5.5 CUT EDG (BLADE) ×2 IMPLANT
BLADE SURG 15 STRL LF DISP TIS (BLADE) ×2 IMPLANT
BLADE SURG 15 STRL SS (BLADE) ×4
BNDG CMPR 9X4 STRL LF SNTH (GAUZE/BANDAGES/DRESSINGS)
BNDG CMPR 9X6 STRL LF SNTH (GAUZE/BANDAGES/DRESSINGS) ×1
BNDG COHESIVE 4X5 TAN STRL (GAUZE/BANDAGES/DRESSINGS) ×2 IMPLANT
BNDG CONFORM 2 STRL LF (GAUZE/BANDAGES/DRESSINGS) IMPLANT
BNDG CONFORM 3 STRL LF (GAUZE/BANDAGES/DRESSINGS) ×2 IMPLANT
BNDG ESMARK 4X9 LF (GAUZE/BANDAGES/DRESSINGS) IMPLANT
BNDG ESMARK 6X9 LF (GAUZE/BANDAGES/DRESSINGS) ×2
CAP PIN PROTECTOR ORTHO WHT (CAP) IMPLANT
CHLORAPREP W/TINT 26ML (MISCELLANEOUS) ×2 IMPLANT
COVER BACK TABLE 60X90IN (DRAPES) ×2 IMPLANT
CUFF TOURNIQUET SINGLE 34IN LL (TOURNIQUET CUFF) ×1 IMPLANT
DRAPE EXTREMITY T 121X128X90 (DRAPE) ×2 IMPLANT
DRAPE OEC MINIVIEW 54X84 (DRAPES) ×2 IMPLANT
DRAPE U-SHAPE 47X51 STRL (DRAPES) ×2 IMPLANT
DRSG EMULSION OIL 3X3 NADH (GAUZE/BANDAGES/DRESSINGS) ×2 IMPLANT
DRSG PAD ABDOMINAL 8X10 ST (GAUZE/BANDAGES/DRESSINGS) IMPLANT
ELECT REM PT RETURN 9FT ADLT (ELECTROSURGICAL) ×2
ELECTRODE REM PT RTRN 9FT ADLT (ELECTROSURGICAL) ×1 IMPLANT
GAUZE SPONGE 4X4 12PLY STRL (GAUZE/BANDAGES/DRESSINGS) ×2 IMPLANT
GLOVE BIO SURGEON STRL SZ8 (GLOVE) ×2 IMPLANT
GLOVE BIOGEL PI IND STRL 7.0 (GLOVE) IMPLANT
GLOVE BIOGEL PI IND STRL 8 (GLOVE) ×1 IMPLANT
GLOVE BIOGEL PI INDICATOR 7.0 (GLOVE) ×1
GLOVE BIOGEL PI INDICATOR 8 (GLOVE) ×1
GLOVE ECLIPSE 6.5 STRL STRAW (GLOVE) ×1 IMPLANT
GLOVE EXAM NITRILE MD LF STRL (GLOVE) ×1 IMPLANT
GOWN STRL REUS W/ TWL LRG LVL3 (GOWN DISPOSABLE) ×1 IMPLANT
GOWN STRL REUS W/ TWL XL LVL3 (GOWN DISPOSABLE) ×1 IMPLANT
GOWN STRL REUS W/TWL LRG LVL3 (GOWN DISPOSABLE) ×2
GOWN STRL REUS W/TWL XL LVL3 (GOWN DISPOSABLE) ×2
GUIDEWIRE 1.6 (WIRE) ×2
GUIDEWIRE ORTH 157X1.6XTROC (WIRE) IMPLANT
K-WIRE .054X4 (WIRE) IMPLANT
KIT SUT CPR VIP IMPL DBL PIGTA (Orthopedic Implant) ×1 IMPLANT
NEEDLE HYPO 22GX1.5 SAFETY (NEEDLE) IMPLANT
NS IRRIG 1000ML POUR BTL (IV SOLUTION) ×2 IMPLANT
PACK BASIN DAY SURGERY FS (CUSTOM PROCEDURE TRAY) ×2 IMPLANT
PAD CAST 4YDX4 CTTN HI CHSV (CAST SUPPLIES) ×1 IMPLANT
PADDING CAST ABS 4INX4YD NS (CAST SUPPLIES) ×1
PADDING CAST ABS COTTON 4X4 ST (CAST SUPPLIES) ×1 IMPLANT
PADDING CAST COTTON 4X4 STRL (CAST SUPPLIES) ×2
PASSER SUT SWANSON 36MM LOOP (INSTRUMENTS) IMPLANT
PENCIL BUTTON HOLSTER BLD 10FT (ELECTRODE) ×1 IMPLANT
SANITIZER HAND PURELL 535ML FO (MISCELLANEOUS) ×2 IMPLANT
SCREW FUSION ACUTRAK 30 3000 (Screw) IMPLANT
SCREW FUSION ACUTRAK 30MM 3000 (Screw) ×2 IMPLANT
SCREW QUICK SNAP 2.0X14MM (Screw) ×2 IMPLANT
SHEET MEDIUM DRAPE 40X70 STRL (DRAPES) ×2 IMPLANT
SLEEVE SCD COMPRESS KNEE MED (MISCELLANEOUS) ×2 IMPLANT
SPONGE LAP 18X18 X RAY DECT (DISPOSABLE) ×2 IMPLANT
STOCKINETTE 6  STRL (DRAPES) ×1
STOCKINETTE 6 STRL (DRAPES) ×1 IMPLANT
SUCTION FRAZIER TIP 10 FR DISP (SUCTIONS) ×2 IMPLANT
SUT ETHILON 3 0 PS 1 (SUTURE) ×2 IMPLANT
SUT MNCRL AB 3-0 PS2 18 (SUTURE) ×2 IMPLANT
SUT VIC AB 2-0 SH 27 (SUTURE) ×2
SUT VIC AB 2-0 SH 27XBRD (SUTURE) IMPLANT
SYR BULB 3OZ (MISCELLANEOUS) ×2 IMPLANT
SYR CONTROL 10ML LL (SYRINGE) IMPLANT
TOWEL OR 17X24 6PK STRL BLUE (TOWEL DISPOSABLE) ×3 IMPLANT
TUBE CONNECTING 20X1/4 (TUBING) ×2 IMPLANT
UNDERPAD 30X30 INCONTINENT (UNDERPADS AND DIAPERS) ×2 IMPLANT
YANKAUER SUCT BULB TIP NO VENT (SUCTIONS) IMPLANT

## 2014-03-16 NOTE — Progress Notes (Signed)
Assisted Dr. Rob Fitzgerald with right, ultrasound guided, popliteal block. Side rails up, monitors on throughout procedure. See vital signs in flow sheet. Tolerated Procedure well. 

## 2014-03-16 NOTE — H&P (Signed)
Shane Perkins is an 64 y.o. male.   Chief Complaint: right foot pain HPI: 64 y/o male with PMH of right 2-3 hammertoes.  He has failed non operative treatment and presents now for surgical correction.  Past Medical History  Diagnosis Date  . Hypertension   . Wears contact lenses   . Low testosterone     Past Surgical History  Procedure Laterality Date  . Prostate surgery  2010    bx  . Polypectomy  2011  . Colonoscopy    . Variococele repair      Family History  Problem Relation Age of Onset  . Heart disease Mother   . Hypertension Mother    Social History:  reports that he has never smoked. He does not have any smokeless tobacco history on file. He reports that he drinks alcohol. His drug history is not on file.  Allergies: No Known Allergies  Medications Prior to Admission  Medication Sig Dispense Refill  . candesartan (ATACAND) 32 MG tablet Take 1 tablet (32 mg total) by mouth daily. 90 tablet 3  . pantoprazole (PROTONIX) 40 MG tablet Take 1 tablet (40 mg total) by mouth daily. 90 tablet 3  . carvedilol (COREG) 6.25 MG tablet TAKE 1 TABLET BY MOUTH 2 TIMES DAILY. 180 tablet 3  . tadalafil (CIALIS) 20 MG tablet Take 1 tablet (20 mg total) by mouth as needed for erectile dysfunction. 6 tablet 11  . Testosterone (ANDROGEL) 40.5 MG/2.5GM (1.62%) GEL One pump spray each arm once daily 2.5 g 5    Results for orders placed or performed during the hospital encounter of 03/16/14 (from the past 48 hour(s))  Basic metabolic panel     Status: Abnormal   Collection Time: 03/15/14  9:00 AM  Result Value Ref Range   Sodium 134 (L) 135 - 145 mmol/L   Potassium 4.7 3.5 - 5.1 mmol/L   Chloride 100 96 - 112 mmol/L   CO2 27 19 - 32 mmol/L   Glucose, Bld 111 (H) 70 - 99 mg/dL   BUN 13 6 - 23 mg/dL   Creatinine, Ser 1.04 0.50 - 1.35 mg/dL   Calcium 9.1 8.4 - 10.5 mg/dL   GFR calc non Af Amer 76 (L) >90 mL/min   GFR calc Af Amer 88 (L) >90 mL/min    Comment: (NOTE) The eGFR has  been calculated using the CKD EPI equation. This calculation has not been validated in all clinical situations. eGFR's persistently <90 mL/min signify possible Chronic Kidney Disease.    Anion gap 7 5 - 15  Hemoglobin-hemacue, POC     Status: None   Collection Time: 03/16/14  7:10 AM  Result Value Ref Range   Hemoglobin 15.1 13.0 - 17.0 g/dL   No results found.  ROS  No recent f/c/n/v/wt loss  Blood pressure 125/81, pulse 59, temperature 97.9 F (36.6 C), temperature source Oral, resp. rate 20, height 5' 10" (1.778 m), weight 91.627 kg (202 lb), SpO2 100 %. Physical Exam  wn wd male in nad.  A and O x 4.  Mood and affect normal.  EOMI.  resp unlabored.  R forefoot with 2nd and 3rd hammertoes and long metatarsals.  Normal sens to LT.  2+ dp and pt pulses.  No lymphadenopathy.  5/5 strength in PF and DF of the ankle and toes.  Assessment/Plan R foot 2nd and 3rd hammertoes and metatarsalgia.  To OR for operative treatment of these deformities.  The risks and benefits of the alternative  treatment options have been discussed in detail.  The patient wishes to proceed with surgery and specifically understands risks of bleeding, infection, nerve damage, blood clots, need for additional surgery, amputation and death.   Wylene Simmer 01-Apr-2014, 7:17 AM

## 2014-03-16 NOTE — Anesthesia Postprocedure Evaluation (Signed)
  Anesthesia Post-op Note  Patient: Shane Perkins  Procedure(s) Performed: Procedure(s) with comments: RIGHT SECOND/THIRD METATARSAL WEIL OSTEOSTOMY AND HAMMERTOE CORRECTION (Right) - ANESTHESIA: GENERAL/REGIONAL BLOCK  Patient Location: PACU  Anesthesia Type:GA combined with regional for post-op pain  Level of Consciousness: awake and alert   Airway and Oxygen Therapy: Patient Spontanous Breathing  Post-op Pain: none  Post-op Assessment: Post-op Vital signs reviewed  Post-op Vital Signs: Reviewed  Last Vitals:  Filed Vitals:   03/16/14 1030  BP: 126/71  Pulse: 79  Temp: 36 C  Resp: 18    Complications: No apparent anesthesia complications

## 2014-03-16 NOTE — Brief Op Note (Signed)
03/16/2014  8:59 AM  PATIENT:  Shane Perkins  64 y.o. male  PRE-OPERATIVE DIAGNOSIS: 1.  Right 2nd claw toe      2.  Right 3rd hammertoe      3.  Right 2nd and 3rd metatarsal head overload (metatarsalgia)      POST-OPERATIVE DIAGNOSIS:  Same with rupture of the 2nd plantar plate  Procedure(s): 1.  Right 2nd metatarsal weil osteotomy 2.  Open reduction of 2nd MTP joint dislocation 3.  Right 2nd MTP joint dorsal capsulotomy and extensor tendon lengthening 4.  Right 2nd hammertoe correction (separate incision) 5.  Right 2nd plantar plate reconstruction 6.  Right 3rd MT weil osteotomy (separate incision) 7.  Right 3rd MTP joint dorsal capsulotomy and extensor tendon lengthening 8.  AP and lateral xrays of the right foot  SURGEON:  Wylene Simmer, MD  ASSISTANT: n/a  ANESTHESIA:   General, regional  EBL:  minimal   TOURNIQUET:   Total Tourniquet Time Documented: Thigh (Right) - 41 minutes Total: Thigh (Right) - 41 minutes   COMPLICATIONS:  None apparent  DISPOSITION:  Extubated, awake and stable to recovery.  DICTATION ID:  [id number]

## 2014-03-16 NOTE — Anesthesia Preprocedure Evaluation (Addendum)
Anesthesia Evaluation  Patient identified by MRN, date of birth, ID band Patient awake    Reviewed: Allergy & Precautions, NPO status   Airway Mallampati: I  TM Distance: >3 FB Neck ROM: Full    Dental  (+) Teeth Intact, Dental Advisory Given   Pulmonary neg pulmonary ROS,  breath sounds clear to auscultation        Cardiovascular hypertension, Pt. on medications Rhythm:Regular Rate:Normal     Neuro/Psych negative neurological ROS     GI/Hepatic negative GI ROS, Neg liver ROS,   Endo/Other  negative endocrine ROS  Renal/GU negative Renal ROS     Musculoskeletal   Abdominal   Peds  Hematology negative hematology ROS (+)   Anesthesia Other Findings   Reproductive/Obstetrics                            Anesthesia Physical Anesthesia Plan  ASA: II  Anesthesia Plan: General and Regional   Post-op Pain Management:    Induction: Intravenous  Airway Management Planned: LMA  Additional Equipment:   Intra-op Plan:   Post-operative Plan: Extubation in OR  Informed Consent: I have reviewed the patients History and Physical, chart, labs and discussed the procedure including the risks, benefits and alternatives for the proposed anesthesia with the patient or authorized representative who has indicated his/her understanding and acceptance.   Dental advisory given  Plan Discussed with: CRNA  Anesthesia Plan Comments:         Anesthesia Quick Evaluation

## 2014-03-16 NOTE — Op Note (Signed)
NAMEVUK, SKILLERN NO.:  [id number]  MEDICAL RECORD NO.:  [id number]  LOCATION:                                 FACILITY:  PHYSICIAN:  Wylene Simmer, MD             DATE OF BIRTH:  DATE OF PROCEDURE:  03/16/2014 DATE OF DISCHARGE:                              OPERATIVE REPORT   PREOPERATIVE DIAGNOSES: 1. Right second claw-toe deformity. 2. Right third hammertoe deformity. 3. Right second and third metatarsal head overload (metatarsalgia).  POSTOPERATIVE DIAGNOSES: 1. Right second claw-toe deformity. 2. Right third hammertoe deformity. 3. Right second and third metatarsal head overload (metatarsalgia). 4. Rupture of the second toe plantar plate.  PROCEDURES: 1. Right second metatarsal Weil osteotomy. 2. Open reduction of the second MTP joint dislocation. 3. Right second MTP joint dorsal capsulotomy and extensor tendon     lengthening. 4. Right second hammertoe correction through a separate incision. 5. Right second toe plantar plate reconstruction. 6. Right third metatarsal Weil osteotomy through a separate incision. 7. Right third MTP joint dorsal capsulotomy and extensor tendon     lengthening. 8. AP and lateral radiographs of the right foot.  SURGEON:  Wylene Simmer, MD.  ANESTHESIA:  General, regional.  ESTIMATED BLOOD LOSS:  Minimal.  TOURNIQUET TIME:  41 minutes at 250 mmHg.  COMPLICATIONS:  None apparent.  DISPOSITION:  Extubated awake and stable to Recovery.  INDICATIONS FOR PROCEDURE:  The patient is a 64 year old male who complains of a longstanding right second and third toe deformities.  On physical exam, he has a second claw-toe deformity as well as a third hammertoe deformity.  He has failed nonoperative treatment to date and presents today for surgical correction.  He understands the risks and benefits, the alternative treatment options, and elects surgical treatment.  He specifically understands risks of bleeding,  infection, nerve damage, blood clots, need for additional surgery, continued pain, amputation, and death.  PROCEDURE IN DETAIL:  After preoperative consent was obtained and the correct operative site was identified, the patient was brought to the operating room and placed supine on the operating table.  General anesthesia was induced.  Preoperative antibiotics were administered. Surgical time-out was taken.  Right lower extremity was prepped and draped in standard sterile fashion with tourniquet around the thigh. The extremity was exsanguinated and tourniquet was inflated to 250 mmHg. A longitudinal incision was then made over the second MTP joint.  Sharp dissection was carried down through skin and subcutaneous tissue.  The extensor digitorum longus tendon was identified and was noted to be quite tight.  It was lengthened in Z-fashion.  The brevis tendon was left intact.  The dorsal joint capsule was excised in its entirety exposing the metatarsal head.  The metatarsal was then shortened with a Weil osteotomy.  The osteotomy was fixed with a 2 mm Acumed Twist-off screw.  The plantar plate was then carefully inspected.  It was noted to be ruptured over approximately the lateral 2/3rd of its width.  The medial 1/3rd was still intact.  A McGlamry elevator was then used to release the insertion on the proximal phalanx.  The proximal origin was released as  well.  The ends of the plantar plate were identified.  Using the Arthrex plantar plate repair device, two sutures were passed in the leading edges of the plantar plate.  Sutures were then passed through drill holes in the proximal phalanx and these were tagged.  Attention was then turned to the PIP joint which was noted to have a fixed flexion contracture.  The joint was opened with a transverse incision, and sharp dissection was carried down through the extensor mechanism.  The head of the proximal phalanx and base of the middle phalanx  were resected with the oscillating saw.  A K-wire was used to predrill the medullary canals and the Acumed Acutrak 30 mm screw was inserted, was noted to compress the joint appropriately.  At this point, the MP joint was reduced and the previously passed sutures were tightened reconstructing the insertion of the plantar plate appropriately.  The toe was noted to be passively in a neutral position at this point.  The extensor tendon was repaired with 2-0 Vicryl.  Attention was then turned to the third MTP joint where a longitudinal incision was made.  Sharp dissection was carried down through the skin and subcutaneous tissue.  The extensor digitorum longus tendon was noted to be quite tight and was lengthened in Z-fashion.  The dorsal joint capsule was excised in its entirety.  The third metatarsal head was exposed and a Weil osteotomy was performed.  The osteotomy was again fixed with a 2 mm Acumed Twist-off screw.  The overhanging bone was trimmed with a rongeur at both joints.  At this point, the third toe was noted to be corrected to a neutral position with no residual PIP joint flexion contracture.  All wounds were then irrigated copiously.  AP and lateral radiographs were obtained showing appropriate position and length of all hardware and appropriate correction of the second and third toe deformities.  The MTP joint dislocation had been reduced appropriately and corrected with a plantar plate reconstruction.  Wounds were then irrigated copiously and closed with horizontal mattress sutures and running sutures of 3-0 nylon.  Sterile dressings were applied followed by well-padded compression wrap.  Tourniquet was released prior to closure and hemostasis was achieved.  The patient was then awakened from anesthesia and transported to the recovery room in stable condition.  FOLLOWUP PLAN:  The patient will be weightbearing as tolerated on his heel and a Darco wedge style shoe.  He will  follow up with me in 2 weeks for suture removal.  RADIOGRAPHS:  AP and lateral radiographs of the right foot were obtained intraoperatively.  These show interval correction of the second MTP joint dislocation as well as the hammertoe deformity and the second and third metatarsal overload.  No acute injuries noted.     Wylene Simmer, MD     JH/MEDQ  D:  03/16/2014  T:  03/16/2014  Job:  [id number]

## 2014-03-16 NOTE — Transfer of Care (Signed)
Immediate Anesthesia Transfer of Care Note  Patient: Shane Perkins  Procedure(s) Performed: Procedure(s) with comments: RIGHT SECOND/THIRD METATARSAL WEIL OSTEOSTOMY AND HAMMERTOE CORRECTION (Right) - ANESTHESIA: GENERAL/REGIONAL BLOCK  Patient Location: PACU  Anesthesia Type:GA combined with regional for post-op pain  Level of Consciousness: awake and patient cooperative  Airway & Oxygen Therapy: Patient Spontanous Breathing and Patient connected to face mask oxygen  Post-op Assessment: Report given to RN and Post -op Vital signs reviewed and stable  Post vital signs: Reviewed and stable  Last Vitals:  Filed Vitals:   03/16/14 0730  BP: 130/74  Pulse:   Temp:   Resp: 16    Complications: No apparent anesthesia complications

## 2014-03-16 NOTE — Anesthesia Procedure Notes (Addendum)
Anesthesia Regional Block:  Popliteal block  Pre-Anesthetic Checklist: ,, timeout performed, Correct Patient, Correct Site, Correct Laterality, Correct Procedure, Correct Position, site marked, Risks and benefits discussed,  Surgical consent,  Pre-op evaluation,  At surgeon's request and post-op pain management  Laterality: Right  Prep: chloraprep       Needles:  Injection technique: Single-shot  Needle Type: Echogenic Stimulator Needle     Needle Length: 9cm 9 cm Needle Gauge: 21 and 21 G    Additional Needles:  Procedures: ultrasound guided (picture in chart) and nerve stimulator Popliteal block  Nerve Stimulator or Paresthesia:  Response: Tibial, 0.4 mA,  Response: peorneal , 0.4 mA,   Additional Responses:   Narrative:  Injection made incrementally with aspirations every 5 mL.  Performed by: Personally  Anesthesiologist: Suzette Battiest E  Additional Notes: Risks and benefits explained to pt. Pt tolerated procedure well with no immediate complications.   Procedure Name: LMA Insertion Date/Time: 03/16/2014 7:38 AM Performed by: Montia Haslip Pre-anesthesia Checklist: Patient identified, Emergency Drugs available, Suction available and Patient being monitored Patient Re-evaluated:Patient Re-evaluated prior to inductionOxygen Delivery Method: Circle System Utilized Preoxygenation: Pre-oxygenation with 100% oxygen Intubation Type: IV induction Ventilation: Mask ventilation without difficulty LMA: LMA inserted LMA Size: 4.0 Number of attempts: 1 Airway Equipment and Method: Bite block Placement Confirmation: positive ETCO2 Tube secured with: Tape Dental Injury: Teeth and Oropharynx as per pre-operative assessment

## 2014-03-16 NOTE — Discharge Instructions (Signed)
Wylene Simmer, MD [place]  Please read the following information regarding your care after surgery.  Medications  You only need a prescription for the narcotic pain medicine (ex. oxycodone, Percocet, Norco).  All of the other medicines listed below are available over the counter. X norco as prescribed for moderate to severe pain.  OR acetominophen 650 mg every 6 hours as needed for pain  Narcotic pain medicine (ex. oxycodone, Percocet, Vicodin) will cause constipation.  To prevent this problem, take the following medicines while you are taking any pain medicine. X docusate sodium (Colace) 100 mg twice a day X senna (Senokot) 2 tablets twice a day  Weight Bearing ? Bear weight when you are able on your operated leg or foot. X Bear weight only on the heel of your operated foot in the post-op shoe. ? Do not bear any weight on the operated leg or foot.  Cast / Splint / Dressing X Keep your splint or cast clean and dry.  Dont put anything (coat hanger, pencil, etc) down inside of it.  If it gets damp, use a hair dryer on the cool setting to dry it.  If it gets soaked, call the office to schedule an appointment for a cast change. ? Remove your dressing 3 days after surgery and cover the incisions with dry dressings.    After your dressing, cast or splint is removed; you may shower, but do not soak or scrub the wound.  Allow the water to run over it, and then gently pat it dry.  Swelling It is normal for you to have swelling where you had surgery.  To reduce swelling and pain, keep your toes above your nose for at least 3 days after surgery.  It may be necessary to keep your foot or leg elevated for several weeks.  If it hurts, it should be elevated.  Follow Up Call my office at [phone] when you are discharged from the hospital or surgery center to schedule an appointment to be seen two weeks after surgery.  Call my office at [phone] if you develop a fever >101.5 F,  nausea, vomiting, bleeding from the surgical site or severe pain.    Regional Anesthesia Blocks  1. Numbness or the inability to move the "blocked" extremity may last from 3-48 hours after placement. The length of time depends on the medication injected and your individual response to the medication. If the numbness is not going away after 48 hours, call your surgeon.  2. The extremity that is blocked will need to be protected until the numbness is gone and the  Strength has returned. Because you cannot feel it, you will need to take extra care to avoid injury. Because it may be weak, you may have difficulty moving it or using it. You may not know what position it is in without looking at it while the block is in effect.  3. For blocks in the legs and feet, returning to weight bearing and walking needs to be done carefully. You will need to wait until the numbness is entirely gone and the strength has returned. You should be able to move your leg and foot normally before you try and bear weight or walk. You will need someone to be with you when you first try to ensure you do not fall and possibly risk injury.  4. Bruising and tenderness at the needle site are common side effects and will resolve in a few days.  5. Persistent numbness or new  problems with movement should be communicated to the surgeon or the [facility] [phone] [place] [phone]).   Post Anesthesia Home Care Instructions  Activity: Get plenty of rest for the remainder of the day. A responsible adult should stay with you for 24 hours following the procedure.  For the next 24 hours, DO NOT: -Drive a car -Paediatric nurse -Drink alcoholic beverages -Take any medication unless instructed by your physician -Make any legal decisions or sign important papers.  Meals: Start with liquid foods such as gelatin or soup. Progress to regular foods as tolerated. Avoid greasy, spicy, heavy foods.  If nausea and/or vomiting occur, drink only clear liquids until the nausea and/or vomiting subsides. Call your physician if vomiting continues.  Special Instructions/Symptoms: Your throat may feel dry or sore from the anesthesia or the breathing tube placed in your throat during surgery. If this causes discomfort, gargle with warm salt water. The discomfort should disappear within 24 hours.

## 2014-03-20 ENCOUNTER — Encounter (HOSPITAL_BASED_OUTPATIENT_CLINIC_OR_DEPARTMENT_OTHER): Payer: Self-pay | Admitting: Orthopedic Surgery

## 2014-07-06 ENCOUNTER — Ambulatory Visit (INDEPENDENT_AMBULATORY_CARE_PROVIDER_SITE_OTHER): Payer: 59 | Admitting: Family Medicine

## 2014-07-06 ENCOUNTER — Encounter: Payer: Self-pay | Admitting: Family Medicine

## 2014-07-06 VITALS — BP 128/80 | HR 61 | Temp 97.4°F | Ht 70.08 in | Wt 195.0 lb

## 2014-07-06 DIAGNOSIS — Z0000 Encounter for general adult medical examination without abnormal findings: Secondary | ICD-10-CM

## 2014-07-06 MED ORDER — PANTOPRAZOLE SODIUM 40 MG PO TBEC: 40.0000 mg | DELAYED_RELEASE_TABLET | Freq: Every day | ORAL | Status: DC

## 2014-07-06 MED ORDER — CANDESARTAN CILEXETIL 32 MG PO TABS: 32.0000 mg | ORAL_TABLET | Freq: Every day | ORAL | Status: DC

## 2014-07-06 MED ORDER — CARVEDILOL 6.25 MG PO TABS: ORAL_TABLET | ORAL | Status: DC

## 2014-07-06 NOTE — Progress Notes (Signed)
   Subjective:    Patient ID: Shane Perkins, male    DOB: 04-Oct-1950, 64 y.o.   MRN: [id number]  HPI Patient here for complete physical. Had foot surgery within the past year which went well. He started back more consistent exercise. Past history significant for hypertension, low testosterone, elevated PSA -followed by urology.  Health maintenance-previous shingles vaccine and tetanus last year. Colonoscopy up-to-date.  Had recent lab work reportedly through hospital and he will forward those copies to [place] for review. He does not recall any significant abnormalities. His blood sugar was slightly elevated in prediabetes range but this was not a true fasting as he had had something to eat that morning.  Past Medical History  Diagnosis Date  . Hypertension   . Wears contact lenses   . Low testosterone    Past Surgical History  Procedure Laterality Date  . Prostate surgery  2010    bx  . Polypectomy  2011  . Colonoscopy    . Variococele repair    . Hammertoe reconstruction with weil osteotomy Right 03/16/2014    Procedure: RIGHT SECOND/THIRD METATARSAL WEIL OSTEOSTOMY AND HAMMERTOE CORRECTION;  Surgeon: Wylene Simmer, MD;  Location: [facility];  Service: Orthopedics;  Laterality: Right;  ANESTHESIA: GENERAL/REGIONAL BLOCK    reports that he has never smoked. He does not have any smokeless tobacco history on file. He reports that he drinks alcohol. His drug history is not on file. family history includes Heart disease in his mother; Hypertension in his mother. No Known Allergies    Review of Systems  Constitutional: Negative for fever, activity change, appetite change, fatigue and unexpected weight change.  HENT: Negative for congestion, ear pain and trouble swallowing.   Eyes: Negative for pain and visual disturbance.  Respiratory: Negative for cough, chest tightness, shortness of breath and wheezing.   Cardiovascular: Negative for chest pain, palpitations and leg  swelling.  Gastrointestinal: Negative for nausea, vomiting, abdominal pain, diarrhea, constipation, blood in stool, abdominal distention and rectal pain.  Endocrine: Negative for polydipsia and polyuria.  Genitourinary: Negative for dysuria, hematuria and testicular pain.  Musculoskeletal: Negative for joint swelling and arthralgias.  Skin: Negative for rash.  Neurological: Negative for dizziness, syncope, weakness, light-headedness and headaches.  Hematological: Negative for adenopathy.  Psychiatric/Behavioral: Negative for confusion and dysphoric mood.       Objective:   Physical Exam  Constitutional: He appears well-developed and well-nourished.  HENT:  Head: Normocephalic and atraumatic.  Right Ear: External ear normal.  Left Ear: External ear normal.  Mouth/Throat: Oropharynx is clear and moist.  Eyes: Pupils are equal, round, and reactive to light.  Neck: Neck supple. No thyromegaly present.  Cardiovascular: Normal rate and regular rhythm.  Exam reveals no gallop.   No murmur heard. Pulmonary/Chest: Effort normal and breath sounds normal. No respiratory distress. He has no wheezes. He has no rales.  Abdominal: Soft. Bowel sounds are normal. He exhibits no distension. There is no tenderness. There is no rebound and no guarding.  Genitourinary:  Per  urology  Musculoskeletal: He exhibits no edema.  Lymphadenopathy:    He has no cervical adenopathy.          Assessment & Plan:  Health maintenance. Patient will forward labs he had done to [place]. Refill medications including candesartan, Coreg, Protonix Immunizations are up-to-date.

## 2014-07-06 NOTE — Progress Notes (Signed)
Pre visit review using our clinic review tool, if applicable. No additional management support is needed unless otherwise documented below in the visit note. 

## 2014-09-08 ENCOUNTER — Other Ambulatory Visit: Payer: Self-pay | Admitting: Family Medicine

## 2014-09-08 NOTE — Telephone Encounter (Signed)
Last visit 07/06/14 Last refill 01/24/14 2.5g 5 refills

## 2014-09-10 NOTE — Telephone Encounter (Signed)
Refill with 5 additional refills. 

## 2014-09-19 ENCOUNTER — Encounter: Payer: Self-pay | Admitting: Internal Medicine

## 2014-12-19 ENCOUNTER — Encounter: Payer: Self-pay | Admitting: Internal Medicine

## 2015-01-12 LAB — HEPATIC FUNCTION PANEL
ALT: 22 U/L (ref 10–40)
AST: 27 U/L (ref 14–40)
Bilirubin, Total: 0.5 mg/dL

## 2015-01-12 LAB — BASIC METABOLIC PANEL
Creatinine: 1 mg/dL (ref 0.6–1.3)
Glucose: 112 mg/dL
Potassium: 4.1 mmol/L (ref 3.4–5.3)
Sodium: 132 mmol/L — AB (ref 137–147)

## 2015-01-12 LAB — CBC AND DIFFERENTIAL
Hemoglobin: 14.4 g/dL (ref 13.5–17.5)
Platelets: 183 10*3/uL (ref 150–399)
WBC: 5.7 10^3/mL

## 2015-02-12 DIAGNOSIS — E291 Testicular hypofunction: Secondary | ICD-10-CM | POA: Diagnosis not present

## 2015-02-12 DIAGNOSIS — R3911 Hesitancy of micturition: Secondary | ICD-10-CM | POA: Diagnosis not present

## 2015-02-12 DIAGNOSIS — N529 Male erectile dysfunction, unspecified: Secondary | ICD-10-CM | POA: Diagnosis not present

## 2015-02-12 DIAGNOSIS — R972 Elevated prostate specific antigen [PSA]: Secondary | ICD-10-CM | POA: Diagnosis not present

## 2015-02-12 DIAGNOSIS — N401 Enlarged prostate with lower urinary tract symptoms: Secondary | ICD-10-CM | POA: Diagnosis not present

## 2015-02-12 MED FILL — CANDESARTAN CILEXETIL 32 MG: 32 | 90 days supply | Qty: 90 | Fill #2

## 2015-03-07 ENCOUNTER — Telehealth: Payer: Self-pay | Admitting: Family Medicine

## 2015-03-07 NOTE — Telephone Encounter (Signed)
Prior authorization for Testosterone (ANDROGEL) 40.5 MG/2.5GM (1.62%) GEL has been denied by [employer] stating:  The plan provides coverage for the prescribed medication for individuals with two pre-treatment serum total testosterone levels less than 280 ng/dL(less than 9.7nmol/L) or less than reference range for the lab;  OR for individuals with a condition that may cause altered sex-hormone binding globulin (e.g, thyroid disorder, HIV disease, liver disorder, diabetes, obesity) and have one pre-treatment calculated free or bioavailable testosterone levels less than 5ng/dL (;ess than 0.17 nmol/L) or less than the reference range for the lab;  OR individuals with a history of one of the following: bilateral orchiectomy, panhypopituitarism, or a genetic disorder known to cause hypogonadism (e.g.,congenital anorchia, Klinefelter's syndrome).

## 2015-03-11 NOTE — Telephone Encounter (Signed)
Last year we had to go through lengthy process to get this approved.  What do we need to do to get approved?

## 2015-03-13 NOTE — Telephone Encounter (Signed)
Would you mind helping me with this?

## 2015-03-19 NOTE — Telephone Encounter (Signed)
Working on submitting an appeal to insurance.  However, we will need pre-treatment testosterone levels to submit with appeal.  I have contacted Dr. Tresa Endo at [facility] and requested this records.

## 2015-03-22 NOTE — Telephone Encounter (Signed)
Per Mychart message, Dr. Lia Foyer will bring in lab results to submit with appeal letter on Monday when he comes for his scheduled appt with Dr. Elease Hashimoto.

## 2015-03-26 ENCOUNTER — Ambulatory Visit (INDEPENDENT_AMBULATORY_CARE_PROVIDER_SITE_OTHER): Payer: 59 | Admitting: Family Medicine

## 2015-03-26 VITALS — HR 83 | Temp 97.5°F | Ht 69.25 in | Wt 196.6 lb

## 2015-03-26 DIAGNOSIS — M546 Pain in thoracic spine: Secondary | ICD-10-CM | POA: Diagnosis not present

## 2015-03-26 NOTE — Progress Notes (Signed)
Pre visit review using our clinic review tool, if applicable. No additional management support is needed unless otherwise documented below in the visit note. 

## 2015-03-26 NOTE — Progress Notes (Signed)
   Subjective:    Patient ID: Shane Perkins, male    DOB: 01-12-1951, 65 y.o.   MRN: [id number]  HPI  Patient seen with somewhat intermittent pain inferior to right scapular region for the past couple months. He has recently resumed weight lifting. Denies any pleuritic pain, cough, dyspnea, fevers, or chills. No associated skin rash. No appetite or weight changes.   Pain tends to be worse with movement. Symptoms are relatively mild.  No alleviating factors. Also has more chronic low-grade left lower cervical neck pains without any associated left upper extremity weakness. He's had occasional numbness mostly involving the third and fourth digits.  Those symptoms have been relatively stable.   Recent episode of chest pain in [facility] [place] went to emergency department and chest x-ray and EKG reportedly normal. Felt to be reflux related. Currently managing without proton pump inhibitor.  Past Medical History  Diagnosis Date  . Hypertension   . Wears contact lenses   . Low testosterone    Past Surgical History  Procedure Laterality Date  . Prostate surgery  2010    bx  . Polypectomy  2011  . Colonoscopy    . Variococele repair    . Hammertoe reconstruction with weil osteotomy Right 03/16/2014    Procedure: RIGHT SECOND/THIRD METATARSAL WEIL OSTEOSTOMY AND HAMMERTOE CORRECTION;  Surgeon: Wylene Simmer, MD;  Location: [place];  Service: Orthopedics;  Laterality: Right;  ANESTHESIA: GENERAL/REGIONAL BLOCK    reports that he has never smoked. He does not have any smokeless tobacco history on file. He reports that he drinks alcohol. His drug history is not on file. family history includes Heart disease in his mother; Hypertension in his mother. No Known Allergies    Review of Systems  Constitutional: Negative for fever, chills and appetite change.  Respiratory: Negative for cough and shortness of breath.   Cardiovascular: Negative for chest pain.    Gastrointestinal: Negative for abdominal pain.       Objective:   Physical Exam  Constitutional: He appears well-developed and well-nourished.  Cardiovascular: Normal rate and regular rhythm.   Pulmonary/Chest: Effort normal and breath sounds normal. No respiratory distress. He has no wheezes. He has no rales.  Musculoskeletal: He exhibits no edema.  Minimal tenderness just inferior to right scapular region. No spinal tenderness thoracic region. No skin rash.  Neurological:  Symmetric reflexes upper extremities          Assessment & Plan:  Mid right thoracic back pain. Suspect musculoskeletal. Observe for now. Continue weight lifting activities as tolerated.

## 2015-03-28 ENCOUNTER — Encounter: Payer: Self-pay | Admitting: Family Medicine

## 2015-04-03 ENCOUNTER — Encounter: Payer: Self-pay | Admitting: Family Medicine

## 2015-04-05 NOTE — Telephone Encounter (Signed)
Appeal that was submitted to insurance([employer]) has been denied.   PCP will need to call [phone] to speak with physician reviewer in an attempt to get coverage approved  for this medication.

## 2015-04-11 ENCOUNTER — Encounter: Payer: Self-pay | Admitting: Family Medicine

## 2015-04-12 ENCOUNTER — Telehealth: Payer: Self-pay | Admitting: Family Medicine

## 2015-04-12 DIAGNOSIS — M546 Pain in thoracic spine: Secondary | ICD-10-CM

## 2015-04-12 NOTE — Telephone Encounter (Signed)
Spoke with patient. Persistent thoracic spine pain around T8-T12. This has been progressive over several weeks. No injury. Denies any appetite or weight changes. No fever. No night sweats. Set up thoracic spine films along with labs including CBC, comprehensive metabolic panel, and sedimentation rate. Patient agrees to this plan.

## 2015-04-13 ENCOUNTER — Ambulatory Visit (INDEPENDENT_AMBULATORY_CARE_PROVIDER_SITE_OTHER)
Admission: RE | Admit: 2015-04-13 | Discharge: 2015-04-13 | Disposition: A | Discharge status: Home / Self Care | Payer: 59 | Source: Ambulatory Visit | Attending: Family Medicine | Admitting: Family Medicine

## 2015-04-13 ENCOUNTER — Other Ambulatory Visit (INDEPENDENT_AMBULATORY_CARE_PROVIDER_SITE_OTHER): Payer: 59

## 2015-04-13 DIAGNOSIS — M546 Pain in thoracic spine: Secondary | ICD-10-CM

## 2015-04-13 LAB — CBC WITH DIFFERENTIAL/PLATELET
Basophils Absolute: 0 10*3/uL (ref 0.0–0.1)
Basophils Relative: 0.9 % (ref 0.0–3.0)
Eosinophils Absolute: 0.1 10*3/uL (ref 0.0–0.7)
Eosinophils Relative: 1.9 % (ref 0.0–5.0)
HCT: 42.1 % (ref 39.0–52.0)
Hemoglobin: 14.4 g/dL (ref 13.0–17.0)
Lymphocytes Relative: 43.5 % (ref 12.0–46.0)
Lymphs Abs: 2.1 10*3/uL (ref 0.7–4.0)
MCHC: 34.3 g/dL (ref 30.0–36.0)
MCV: 94.1 fl (ref 78.0–100.0)
Monocytes Absolute: 0.4 10*3/uL (ref 0.1–1.0)
Monocytes Relative: 9 % (ref 3.0–12.0)
Neutro Abs: 2.2 10*3/uL (ref 1.4–7.7)
Neutrophils Relative %: 44.7 % (ref 43.0–77.0)
Platelets: 221 10*3/uL (ref 150.0–400.0)
RBC: 4.47 Mil/uL (ref 4.22–5.81)
RDW: 13 % (ref 11.5–15.5)
WBC: 4.9 10*3/uL (ref 4.0–10.5)

## 2015-04-13 LAB — COMPREHENSIVE METABOLIC PANEL
ALT: 15 U/L (ref 0–53)
AST: 16 U/L (ref 0–37)
Albumin: 4.1 g/dL (ref 3.5–5.2)
Alkaline Phosphatase: 42 U/L (ref 39–117)
BUN: 21 mg/dL (ref 6–23)
CO2: 30 mEq/L (ref 19–32)
Calcium: 9.4 mg/dL (ref 8.4–10.5)
Chloride: 101 mEq/L (ref 96–112)
Creatinine, Ser: 1.06 mg/dL (ref 0.40–1.50)
GFR: 75 mL/min (ref >60.00)
Glucose, Bld: 114 mg/dL — ABNORMAL HIGH (ref 70–99)
Potassium: 4.3 mEq/L (ref 3.5–5.1)
Sodium: 135 mEq/L (ref 135–145)
Total Bilirubin: 0.4 mg/dL (ref 0.2–1.2)
Total Protein: 6.8 g/dL (ref 6.0–8.3)

## 2015-04-13 LAB — SEDIMENTATION RATE: Sed Rate: 3 mm/hr (ref 0–22)

## 2015-04-15 ENCOUNTER — Encounter: Payer: Self-pay | Admitting: Family Medicine

## 2015-05-10 DIAGNOSIS — N400 Enlarged prostate without lower urinary tract symptoms: Secondary | ICD-10-CM | POA: Diagnosis not present

## 2015-05-13 ENCOUNTER — Encounter: Payer: Self-pay | Admitting: Family Medicine

## 2015-05-18 ENCOUNTER — Other Ambulatory Visit: Payer: Self-pay | Admitting: Family Medicine

## 2015-05-18 MED ORDER — TESTOSTERONE 20.25 MG/ACT (1.62%) TD GEL: TRANSDERMAL | Status: DC

## 2015-05-18 MED FILL — CANDESARTAN CILEXETIL 32 MG: 32 | 90 days supply | Qty: 90 | Fill #3

## 2015-05-18 NOTE — Telephone Encounter (Signed)
Refills OK. 

## 2015-05-18 NOTE — Telephone Encounter (Signed)
Last seen 03/26/2015  Last filled 09/11/2014

## 2015-05-25 ENCOUNTER — Encounter: Payer: Self-pay | Admitting: Family Medicine

## 2015-05-28 ENCOUNTER — Other Ambulatory Visit: Payer: Self-pay | Admitting: Family Medicine

## 2015-05-28 MED ORDER — TESTOSTERONE 20.25 MG/ACT (1.62%) TD GEL: TRANSDERMAL | Status: DC

## 2015-05-28 MED FILL — ANDROGEL 1.62% GEL PUMP: 20.25 MG/AC | 30 days supply | Qty: 75 | Fill #0

## 2015-08-09 MED FILL — ANDROGEL 1.62% GEL PUMP: 20.25 MG/AC | 30 days supply | Qty: 75 | Fill #1

## 2015-08-20 ENCOUNTER — Other Ambulatory Visit: Payer: Self-pay | Admitting: Family Medicine

## 2015-08-21 MED FILL — CANDESARTAN CILEXETIL 32 MG: 32 | 90 days supply | Qty: 90 | Fill #0

## 2015-09-18 DIAGNOSIS — L570 Actinic keratosis: Secondary | ICD-10-CM | POA: Diagnosis not present

## 2015-09-18 DIAGNOSIS — L821 Other seborrheic keratosis: Secondary | ICD-10-CM | POA: Diagnosis not present

## 2015-09-18 DIAGNOSIS — D239 Other benign neoplasm of skin, unspecified: Secondary | ICD-10-CM | POA: Diagnosis not present

## 2015-10-22 ENCOUNTER — Encounter: Payer: Self-pay | Admitting: Internal Medicine

## 2015-10-26 MED FILL — ANDROGEL 1.62% GEL PUMP: 20.25 MG/AC | 30 days supply | Qty: 75 | Fill #2

## 2015-10-29 DIAGNOSIS — N528 Other male erectile dysfunction: Secondary | ICD-10-CM | POA: Diagnosis not present

## 2015-10-29 DIAGNOSIS — R3912 Poor urinary stream: Secondary | ICD-10-CM | POA: Diagnosis not present

## 2015-10-29 DIAGNOSIS — N401 Enlarged prostate with lower urinary tract symptoms: Secondary | ICD-10-CM | POA: Diagnosis not present

## 2015-10-29 DIAGNOSIS — E291 Testicular hypofunction: Secondary | ICD-10-CM | POA: Diagnosis not present

## 2015-10-29 DIAGNOSIS — R351 Nocturia: Secondary | ICD-10-CM | POA: Diagnosis not present

## 2015-10-30 MED FILL — CIALIS 5 MG TABLET: 5 | 30 days supply | Qty: 30 | Fill #0

## 2015-11-20 ENCOUNTER — Encounter: Payer: Self-pay | Admitting: Family Medicine

## 2015-11-20 MED FILL — CANDESARTAN CILEXETIL 32 MG: 32 | 90 days supply | Qty: 90 | Fill #1

## 2015-11-21 ENCOUNTER — Other Ambulatory Visit: Payer: Self-pay

## 2015-11-21 ENCOUNTER — Telehealth: Payer: Self-pay | Admitting: Family Medicine

## 2015-11-21 DIAGNOSIS — R7989 Other specified abnormal findings of blood chemistry: Secondary | ICD-10-CM

## 2015-11-21 MED ORDER — TADALAFIL 5 MG PO TABS: 5.0000 mg | ORAL_TABLET | Freq: Every day | ORAL | 0 refills | Status: DC

## 2015-11-21 NOTE — Telephone Encounter (Signed)
Pt has hx of low testosterone.  Has been followed by Urology for years.   On Androgel and needs CBC, PSA, and testosterone level.  Order placed with copy to Dr Lawerance Bach.

## 2015-11-26 ENCOUNTER — Ambulatory Visit (AMBULATORY_SURGERY_CENTER): Payer: Self-pay | Admitting: *Deleted

## 2015-11-26 VITALS — Ht 70.0 in | Wt 196.0 lb

## 2015-11-26 DIAGNOSIS — Z86010 Personal history of colonic polyps: Secondary | ICD-10-CM

## 2015-11-26 NOTE — Progress Notes (Signed)
Patient denies any allergies to eggs or soy. Patient denies any problems with anesthesia/sedation. Patient denies any oxygen use at home and does not take any diet/weight loss medications. Patient declined EMMI education. 

## 2015-11-30 ENCOUNTER — Other Ambulatory Visit (INDEPENDENT_AMBULATORY_CARE_PROVIDER_SITE_OTHER): Payer: 59

## 2015-11-30 DIAGNOSIS — E349 Endocrine disorder, unspecified: Secondary | ICD-10-CM | POA: Diagnosis not present

## 2015-11-30 DIAGNOSIS — R7989 Other specified abnormal findings of blood chemistry: Secondary | ICD-10-CM

## 2015-11-30 LAB — CBC WITH DIFFERENTIAL/PLATELET
Basophils Absolute: 0 10*3/uL (ref 0.0–0.1)
Basophils Relative: 0.5 % (ref 0.0–3.0)
Eosinophils Absolute: 0 10*3/uL (ref 0.0–0.7)
Eosinophils Relative: 1 % (ref 0.0–5.0)
HCT: 36.5 % — ABNORMAL LOW (ref 39.0–52.0)
Hemoglobin: 12.4 g/dL — ABNORMAL LOW (ref 13.0–17.0)
Lymphocytes Relative: 38.8 % (ref 12.0–46.0)
Lymphs Abs: 1.8 10*3/uL (ref 0.7–4.0)
MCHC: 33.9 g/dL (ref 30.0–36.0)
MCV: 95.5 fl (ref 78.0–100.0)
Monocytes Absolute: 0.5 10*3/uL (ref 0.1–1.0)
Monocytes Relative: 9.9 % (ref 3.0–12.0)
Neutro Abs: 2.3 10*3/uL (ref 1.4–7.7)
Neutrophils Relative %: 49.8 % (ref 43.0–77.0)
Platelets: 196 10*3/uL (ref 150.0–400.0)
RBC: 3.82 Mil/uL — ABNORMAL LOW (ref 4.22–5.81)
RDW: 13.2 % (ref 11.5–15.5)
WBC: 4.6 10*3/uL (ref 4.0–10.5)

## 2015-11-30 LAB — TESTOSTERONE: Testosterone: 268.45 ng/dL — ABNORMAL LOW (ref 300.00–890.00)

## 2015-11-30 LAB — PSA: PSA: 6.98 ng/mL — ABNORMAL HIGH (ref 0.10–4.00)

## 2015-12-03 ENCOUNTER — Encounter: Payer: Self-pay | Admitting: Family Medicine

## 2015-12-07 MED FILL — CIALIS 5 MG TABLET: 5 | 30 days supply | Qty: 30 | Fill #1

## 2015-12-14 ENCOUNTER — Other Ambulatory Visit: Payer: Self-pay

## 2015-12-14 MED ORDER — TESTOSTERONE 20.25 MG/ACT (1.62%) TD GEL: TRANSDERMAL | 2 refills | Status: DC

## 2015-12-14 MED FILL — ANDROGEL 1.62% GEL PUMP: 20.25 MG/AC | 90 days supply | Qty: 225 | Fill #0

## 2015-12-17 ENCOUNTER — Ambulatory Visit (AMBULATORY_SURGERY_CENTER): Payer: 59 | Admitting: Internal Medicine

## 2015-12-17 ENCOUNTER — Encounter: Payer: Self-pay | Admitting: Internal Medicine

## 2015-12-17 VITALS — BP 107/68 | HR 66 | Temp 96.8°F | Resp 12 | Ht 70.0 in | Wt 196.0 lb

## 2015-12-17 DIAGNOSIS — Z86010 Personal history of colonic polyps: Secondary | ICD-10-CM | POA: Diagnosis not present

## 2015-12-17 DIAGNOSIS — I10 Essential (primary) hypertension: Secondary | ICD-10-CM | POA: Diagnosis not present

## 2015-12-17 DIAGNOSIS — K449 Diaphragmatic hernia without obstruction or gangrene: Secondary | ICD-10-CM | POA: Diagnosis not present

## 2015-12-17 DIAGNOSIS — D120 Benign neoplasm of cecum: Secondary | ICD-10-CM | POA: Diagnosis not present

## 2015-12-17 MED ORDER — SODIUM CHLORIDE 0.9 % IV SOLN: 500.0000 mL | INTRAVENOUS | Status: DC

## 2015-12-17 NOTE — Progress Notes (Signed)
Report to PACU, RN, vss, BBS= Clear.  

## 2015-12-17 NOTE — Patient Instructions (Addendum)
I found one 1-2 mm cecal polyp and removed it. All else ok.  I will let you know pathology results and when to have another routine colonoscopy by mail.  I appreciate the opportunity to care for you. Gatha Mayer, MD, FACG   YOU HAD AN ENDOSCOPIC PROCEDURE TODAY AT [facility]:   Refer to the procedure report that was given to you for any specific questions about what was found during the examination.  If the procedure report does not answer your questions, please call your gastroenterologist to clarify.  If you requested that your care partner not be given the details of your procedure findings, then the procedure report has been included in a sealed envelope for you to review at your convenience later.  YOU SHOULD EXPECT: Some feelings of bloating in the abdomen. Passage of more gas than usual.  Walking can help get rid of the air that was put into your GI tract during the procedure and reduce the bloating. If you had a lower endoscopy (such as a colonoscopy or flexible sigmoidoscopy) you may notice spotting of blood in your stool or on the toilet paper. If you underwent a bowel prep for your procedure, you may not have a normal bowel movement for a few days.  Please Note:  You might notice some irritation and congestion in your nose or some drainage.  This is from the oxygen used during your procedure.  There is no need for concern and it should clear up in a day or so.  SYMPTOMS TO REPORT IMMEDIATELY:   Following lower endoscopy (colonoscopy or flexible sigmoidoscopy):  Excessive amounts of blood in the stool  Significant tenderness or worsening of abdominal pains  Swelling of the abdomen that is new, acute  Fever of 100F or higher   For urgent or emergent issues, a gastroenterologist can be reached at any hour by calling [phone].   DIET:  We do recommend a small meal at first, but then you may proceed to your regular diet.  Drink plenty of fluids  but you should avoid alcoholic beverages for 24 hours.  ACTIVITY:  You should plan to take it easy for the rest of today and you should NOT DRIVE or use heavy machinery until tomorrow (because of the sedation medicines used during the test).    FOLLOW UP: Our staff will call the number listed on your records the next business day following your procedure to check on you and address any questions or concerns that you may have regarding the information given to you following your procedure. If we do not reach you, we will leave a message.  However, if you are feeling well and you are not experiencing any problems, there is no need to return our call.  We will assume that you have returned to your regular daily activities without incident.  If any biopsies were taken you will be contacted by phone or by letter within the next 1-3 weeks.  Please call us at [phone] if you have not heard about the biopsies in 3 weeks.    SIGNATURES/CONFIDENTIALITY: You and/or your care partner have signed paperwork which will be entered into your electronic medical record.  These signatures attest to the fact that that the information above on your After Visit Summary has been reviewed and is understood.  Full responsibility of the confidentiality of this discharge information lies with you and/or your care-partner.  Thank-you for choosing [place] for your healthcare needs  today.

## 2015-12-17 NOTE — Progress Notes (Signed)
Called to room to assist during endoscopic procedure.  Patient ID and intended procedure confirmed with present staff. Received instructions for my participation in the procedure from the performing physician.Called to room to assist during endoscopic procedure.  Patient ID and intended procedure confirmed with present staff. Received instructions for my participation in the procedure from the performing physician. 

## 2015-12-17 NOTE — Op Note (Signed)
[facility] Patient Name: Jino Marks Procedure Date: 12/17/2015 2:47 PM MRN: [id number] Endoscopist: Gatha Mayer , MD Age: 65 Referring MD:  Date of Birth: [birth year] Gender: Male Account #: [id number] Procedure:                Colonoscopy Indications:              Surveillance: Personal history of adenomatous                            polyps on last colonoscopy > 5 years ago Medicines:                Propofol per Anesthesia, Monitored Anesthesia Care Procedure:                Pre-Anesthesia Assessment:                           - Prior to the procedure, a History and Physical                            was performed, and patient medications and                            allergies were reviewed. The patient's tolerance of                            previous anesthesia was also reviewed. The risks                            and benefits of the procedure and the sedation                            options and risks were discussed with the patient.                            All questions were answered, and informed consent                            was obtained. Prior Anticoagulants: The patient has                            taken no previous anticoagulant or antiplatelet                            agents. ASA Grade Assessment: II - A patient with                            mild systemic disease. After reviewing the risks                            and benefits, the patient was deemed in                            satisfactory condition to undergo the procedure.  After obtaining informed consent, the colonoscope                            was passed under direct vision. Throughout the                            procedure, the patient's blood pressure, pulse, and                            oxygen saturations were monitored continuously. The                            Model CF-HQ190L [id number]) scope was introduced   through the anus and advanced to the the cecum,                            identified by appendiceal orifice and ileocecal                            valve. The colonoscopy was performed without                            difficulty. The patient tolerated the procedure                            well. The quality of the bowel preparation was                            good. The bowel preparation used was Miralax. The                            ileocecal valve, appendiceal orifice, and rectum                            were photographed. Scope In: 2:52:47 PM Scope Out: 3:11:42 PM Scope Withdrawal Time: 0 hours 14 minutes 20 seconds  Total Procedure Duration: 0 hours 18 minutes 55 seconds  Findings:                 The perianal and digital rectal examinations were                            normal.                           A 1 to 2 mm polyp was found in the cecum. The polyp                            was sessile. The polyp was removed with a cold                            biopsy forceps. Resection and retrieval were                            complete. Verification of  patient identification                            for the specimen was done. Estimated blood loss was                            minimal.                           The exam was otherwise without abnormality on                            direct and retroflexion views. Complications:            No immediate complications. Estimated Blood Loss:     Estimated blood loss was minimal. Impression:               - One 1 to 2 mm polyp in the cecum, removed with a                            cold biopsy forceps. Resected and retrieved.                           - The examination was otherwise normal on direct                            and retroflexion views.                           - Personal history of colonic polyps. adenomas 2006                            and 2011 Recommendation:           - Patient has a contact number  available for                            emergencies. The signs and symptoms of potential                            delayed complications were discussed with the                            patient. Return to normal activities tomorrow.                            Written discharge instructions were provided to the                            patient.                           - Resume previous diet.                           - Continue present medications.                           -  Repeat colonoscopy is recommended for                            surveillance. The colonoscopy date will be                            determined after pathology results from today's                            exam become available for review. Gatha Mayer, MD 12/17/2015 3:19:46 PM This report has been signed electronically.

## 2015-12-18 ENCOUNTER — Telehealth: Payer: Self-pay | Admitting: *Deleted

## 2015-12-18 NOTE — Telephone Encounter (Signed)
  Follow up Call-  Call back number 12/17/2015  Post procedure Call Back phone  # [phone] cell  Permission to leave phone message Yes  Some recent data might be hidden     Patient questions:  Do you have a fever, pain , or abdominal swelling? No. Pain Score  0 *  Have you tolerated food without any problems? Yes.    Have you been able to return to your normal activities? Yes.    Do you have any questions about your discharge instructions: Diet   No. Medications  No. Follow up visit  No.  Do you have questions or concerns about your Care? No.  Actions: * If pain score is 4 or above: No action needed, pain <4.

## 2015-12-20 ENCOUNTER — Encounter: Payer: Self-pay | Admitting: Internal Medicine

## 2015-12-20 NOTE — Progress Notes (Signed)
Diminutive adenoma Recall colonoscopy 2022 My Chart note to patient no letter

## 2015-12-21 DIAGNOSIS — R972 Elevated prostate specific antigen [PSA]: Secondary | ICD-10-CM | POA: Diagnosis not present

## 2015-12-21 DIAGNOSIS — N401 Enlarged prostate with lower urinary tract symptoms: Secondary | ICD-10-CM | POA: Diagnosis not present

## 2015-12-21 DIAGNOSIS — N528 Other male erectile dysfunction: Secondary | ICD-10-CM | POA: Diagnosis not present

## 2015-12-21 DIAGNOSIS — E291 Testicular hypofunction: Secondary | ICD-10-CM | POA: Diagnosis not present

## 2015-12-21 DIAGNOSIS — N138 Other obstructive and reflux uropathy: Secondary | ICD-10-CM | POA: Diagnosis not present

## 2015-12-30 ENCOUNTER — Encounter: Payer: Self-pay | Admitting: Family Medicine

## 2016-01-01 ENCOUNTER — Telehealth: Payer: Self-pay | Admitting: Family Medicine

## 2016-01-01 DIAGNOSIS — R972 Elevated prostate specific antigen [PSA]: Secondary | ICD-10-CM

## 2016-01-01 NOTE — Telephone Encounter (Signed)
Patient's urologist requesting MRI prostate with and without contrast secondary to elevated PSA. His urologist is with [facility] and patient is requesting getting this done through [facility] because of insurance issues.

## 2016-01-09 ENCOUNTER — Ambulatory Visit (HOSPITAL_COMMUNITY): Payer: 59

## 2016-01-11 ENCOUNTER — Encounter (HOSPITAL_COMMUNITY): Payer: Self-pay

## 2016-01-11 MED FILL — CIALIS 5 MG TABLET: 5 | 90 days supply | Qty: 90 | Fill #2

## 2016-01-12 ENCOUNTER — Ambulatory Visit (HOSPITAL_COMMUNITY): Payer: 59

## 2016-01-13 ENCOUNTER — Ambulatory Visit (HOSPITAL_COMMUNITY)
Admission: RE | Admit: 2016-01-13 | Discharge: 2016-01-13 | Disposition: A | Discharge status: Home / Self Care | Payer: 59 | Source: Ambulatory Visit | Attending: Family Medicine | Admitting: Family Medicine

## 2016-01-13 DIAGNOSIS — N429 Disorder of prostate, unspecified: Secondary | ICD-10-CM | POA: Diagnosis not present

## 2016-01-13 DIAGNOSIS — N400 Enlarged prostate without lower urinary tract symptoms: Secondary | ICD-10-CM | POA: Diagnosis not present

## 2016-01-13 DIAGNOSIS — R972 Elevated prostate specific antigen [PSA]: Secondary | ICD-10-CM | POA: Diagnosis present

## 2016-01-13 LAB — POCT I-STAT, CHEM 8
BUN: 14 mg/dL (ref 6–20)
Calcium, Ion: 1.15 mmol/L (ref 1.15–1.40)
Chloride: 100 mmol/L — ABNORMAL LOW (ref 101–111)
Creatinine, Ser: 1 mg/dL (ref 0.61–1.24)
Glucose, Bld: 90 mg/dL (ref 65–99)
HCT: 43 % (ref 39.0–52.0)
Hemoglobin: 14.6 g/dL (ref 13.0–17.0)
Potassium: 4.2 mmol/L (ref 3.5–5.1)
Sodium: 136 mmol/L (ref 135–145)
TCO2: 27 mmol/L (ref 0–100)

## 2016-01-13 MED ORDER — LIDOCAINE HCL 2 % EX GEL: 1.0000 "application " | Freq: Once | CUTANEOUS | Status: DC

## 2016-01-13 MED ORDER — GADOBENATE DIMEGLUMINE 529 MG/ML IV SOLN
18.0000 mL | Freq: Once | INTRAVENOUS | Status: AC | PRN
  Administered 2016-01-13: 18 mL via INTRAVENOUS

## 2016-01-13 MED ORDER — LIDOCAINE HCL 2 % EX GEL
CUTANEOUS | Status: AC
  Filled 2016-01-13: qty 30

## 2016-01-15 ENCOUNTER — Encounter: Payer: Self-pay | Admitting: Family Medicine

## 2016-02-18 DIAGNOSIS — N401 Enlarged prostate with lower urinary tract symptoms: Secondary | ICD-10-CM | POA: Diagnosis not present

## 2016-02-18 DIAGNOSIS — R972 Elevated prostate specific antigen [PSA]: Secondary | ICD-10-CM | POA: Diagnosis not present

## 2016-02-18 DIAGNOSIS — N529 Male erectile dysfunction, unspecified: Secondary | ICD-10-CM | POA: Diagnosis not present

## 2016-02-18 DIAGNOSIS — N138 Other obstructive and reflux uropathy: Secondary | ICD-10-CM | POA: Diagnosis not present

## 2016-02-19 MED FILL — CANDESARTAN CILEXETIL 32 MG: 32 | 90 days supply | Qty: 90 | Fill #2

## 2016-04-21 DIAGNOSIS — N401 Enlarged prostate with lower urinary tract symptoms: Secondary | ICD-10-CM | POA: Diagnosis not present

## 2016-04-21 DIAGNOSIS — N528 Other male erectile dysfunction: Secondary | ICD-10-CM | POA: Diagnosis not present

## 2016-04-21 DIAGNOSIS — N138 Other obstructive and reflux uropathy: Secondary | ICD-10-CM | POA: Diagnosis not present

## 2016-04-21 DIAGNOSIS — E291 Testicular hypofunction: Secondary | ICD-10-CM | POA: Diagnosis not present

## 2016-04-21 DIAGNOSIS — R972 Elevated prostate specific antigen [PSA]: Secondary | ICD-10-CM | POA: Diagnosis not present

## 2016-05-05 ENCOUNTER — Ambulatory Visit (INDEPENDENT_AMBULATORY_CARE_PROVIDER_SITE_OTHER): Payer: 59 | Admitting: Family Medicine

## 2016-05-05 ENCOUNTER — Encounter: Payer: Self-pay | Admitting: Family Medicine

## 2016-05-05 VITALS — BP 124/90 | HR 55 | Temp 97.9°F | Wt 201.0 lb

## 2016-05-05 DIAGNOSIS — L84 Corns and callosities: Secondary | ICD-10-CM

## 2016-05-05 NOTE — Progress Notes (Signed)
Subjective:     Patient ID: Shane Perkins, male   DOB: 04-10-1950, 66 y.o.   MRN: [id number]  HPI Patient seen with some irritation right foot between the fourth and fifth toes. He has history of right hammertoe couple years ago had surgery on the second and third metatarsals. He's been recently training for half marathon.  He's been cleaning recently with peroxide. No drainage. No warmth. No erythema. He feels that the right foot is somewhat wider than the left and thinks this may be contributing. Denies any recent change of shoe wear. He has scheduled follow-up with foot surgeon tomorrow  Past Medical History:  Diagnosis Date  . Colon polyps   . History of colonic polyps 08/13/2004   Qualifier: Diagnosis of  By: Carlean Purl MD, Dimas Millin Hypertension   . Low testosterone   . Wears contact lenses    Past Surgical History:  Procedure Laterality Date  . COLONOSCOPY    . HAMMERTOE RECONSTRUCTION WITH WEIL OSTEOTOMY Right 03/16/2014   Procedure: RIGHT SECOND/THIRD METATARSAL WEIL OSTEOSTOMY AND HAMMERTOE CORRECTION;  Surgeon: Wylene Simmer, MD;  Location: [place];  Service: Orthopedics;  Laterality: Right;  ANESTHESIA: GENERAL/REGIONAL BLOCK  . PROSTATE SURGERY  2010   bx  . [place]    reports that he has never smoked. He has never used smokeless tobacco. He reports that he drinks alcohol. He reports that he does not use drugs. family history includes Colon cancer in his paternal uncle; Colon polyps in his brother; Heart disease in his mother; Hypertension in his mother. No Known Allergies   Review of Systems  Constitutional: Negative for chills and fever.       Objective:   Physical Exam  Constitutional: He appears well-developed and well-nourished.  Skin:  Patient has some callused tissue between the right fourth and fifth toes near the base. No cellulitis changes. Minimally tender. No warmth. No drainage.       Assessment:     Soft  callus/corn right foot between the fourth and fifth toes. No obvious secondary infection    Plan:     -Keep follow-up with foot surgeon as scheduled -Follow-up immediately for any redness, warmth, or drainage.  [name] MD [facility] at [facility]

## 2016-05-05 NOTE — Progress Notes (Signed)
Pre visit review using our clinic review tool, if applicable. No additional management support is needed unless otherwise documented below in the visit note. 

## 2016-05-06 DIAGNOSIS — L84 Corns and callosities: Secondary | ICD-10-CM | POA: Diagnosis not present

## 2016-05-06 DIAGNOSIS — M79671 Pain in right foot: Secondary | ICD-10-CM | POA: Diagnosis not present

## 2016-05-19 MED FILL — CANDESARTAN CILEXETIL 32 MG: 32 | 90 days supply | Qty: 90 | Fill #3

## 2016-07-15 ENCOUNTER — Telehealth: Payer: Self-pay

## 2016-07-15 ENCOUNTER — Other Ambulatory Visit: Payer: Self-pay | Admitting: Family Medicine

## 2016-07-15 NOTE — Telephone Encounter (Signed)
Received PA request for Androgel. PA submitted & pending. Key: [id number]

## 2016-07-21 MED FILL — ANDROGEL 1.62% GEL PUMP: 20.25 MG/AC | 90 days supply | Qty: 225 | Fill #0

## 2016-07-21 NOTE — Telephone Encounter (Signed)
PA approved, form faxed back to pharmacy. 

## 2016-08-14 ENCOUNTER — Other Ambulatory Visit: Payer: Self-pay | Admitting: Family Medicine

## 2016-08-14 MED FILL — CANDESARTAN CILEXETIL 32 MG: 32 | 90 days supply | Qty: 90 | Fill #0

## 2016-10-02 ENCOUNTER — Encounter: Payer: Self-pay | Admitting: Family Medicine

## 2016-10-20 ENCOUNTER — Encounter: Payer: Self-pay | Admitting: Family Medicine

## 2016-10-21 ENCOUNTER — Other Ambulatory Visit: Payer: Self-pay | Admitting: *Deleted

## 2016-10-21 DIAGNOSIS — R7989 Other specified abnormal findings of blood chemistry: Secondary | ICD-10-CM

## 2016-10-21 DIAGNOSIS — Z87898 Personal history of other specified conditions: Secondary | ICD-10-CM

## 2016-11-13 ENCOUNTER — Other Ambulatory Visit: Payer: Self-pay | Admitting: Family Medicine

## 2016-11-13 MED FILL — CANDESARTAN CILEXETIL 32 MG: 32 | 30 days supply | Qty: 30 | Fill #0

## 2016-11-25 ENCOUNTER — Encounter: Payer: Self-pay | Admitting: Family Medicine

## 2016-11-25 ENCOUNTER — Ambulatory Visit (INDEPENDENT_AMBULATORY_CARE_PROVIDER_SITE_OTHER): Payer: 59 | Admitting: Family Medicine

## 2016-11-25 ENCOUNTER — Other Ambulatory Visit (INDEPENDENT_AMBULATORY_CARE_PROVIDER_SITE_OTHER): Payer: 59

## 2016-11-25 VITALS — BP 120/74 | HR 91 | Temp 98.2°F | Ht 69.0 in | Wt 196.8 lb

## 2016-11-25 DIAGNOSIS — Z0000 Encounter for general adult medical examination without abnormal findings: Secondary | ICD-10-CM | POA: Diagnosis not present

## 2016-11-25 DIAGNOSIS — Z87898 Personal history of other specified conditions: Secondary | ICD-10-CM

## 2016-11-25 DIAGNOSIS — R7989 Other specified abnormal findings of blood chemistry: Secondary | ICD-10-CM

## 2016-11-25 LAB — CBC WITH DIFFERENTIAL/PLATELET
Basophils Absolute: 0 10*3/uL (ref 0.0–0.1)
Basophils Relative: 0.8 % (ref 0.0–3.0)
Eosinophils Absolute: 0.1 10*3/uL (ref 0.0–0.7)
Eosinophils Relative: 2.9 % (ref 0.0–5.0)
HCT: 44.8 % (ref 39.0–52.0)
Hemoglobin: 15.1 g/dL (ref 13.0–17.0)
Lymphocytes Relative: 38 % (ref 12.0–46.0)
Lymphs Abs: 1.5 10*3/uL (ref 0.7–4.0)
MCHC: 33.8 g/dL (ref 30.0–36.0)
MCV: 96.7 fl (ref 78.0–100.0)
Monocytes Absolute: 0.4 10*3/uL (ref 0.1–1.0)
Monocytes Relative: 11.5 % (ref 3.0–12.0)
Neutro Abs: 1.8 10*3/uL (ref 1.4–7.7)
Neutrophils Relative %: 46.8 % (ref 43.0–77.0)
Platelets: 221 10*3/uL (ref 150.0–400.0)
RBC: 4.63 Mil/uL (ref 4.22–5.81)
RDW: 13.1 % (ref 11.5–15.5)
WBC: 3.8 10*3/uL — ABNORMAL LOW (ref 4.0–10.5)

## 2016-11-25 LAB — BASIC METABOLIC PANEL
BUN: 14 mg/dL (ref 6–23)
CO2: 29 mEq/L (ref 19–32)
Calcium: 9.4 mg/dL (ref 8.4–10.5)
Chloride: 100 mEq/L (ref 96–112)
Creatinine, Ser: 1.07 mg/dL (ref 0.40–1.50)
GFR: 73.81 mL/min (ref >60.00)
Glucose, Bld: 106 mg/dL — ABNORMAL HIGH (ref 70–99)
Potassium: 4.6 mEq/L (ref 3.5–5.1)
Sodium: 135 mEq/L (ref 135–145)

## 2016-11-25 LAB — TESTOSTERONE: Testosterone: 461.61 ng/dL (ref 300.00–890.00)

## 2016-11-25 LAB — PSA: PSA: 4.56 ng/mL — ABNORMAL HIGH (ref 0.10–4.00)

## 2016-11-25 MED ORDER — SILDENAFIL CITRATE 100 MG PO TABS: 50.0000 mg | ORAL_TABLET | Freq: Every day | ORAL | 11 refills | Status: DC | PRN

## 2016-11-25 MED FILL — SILDENAFIL CITRATE 100 MG T: 100 | 30 days supply | Qty: 6 | Fill #0

## 2016-11-25 NOTE — Progress Notes (Signed)
Subjective:     Patient ID: Shane Perkins, male   DOB: 01-31-1950, 66 y.o.   MRN: [id number]  HPI Patient seen for physical exam. His chronic problems include history of hypertension, low testosterone, and history of elevated PSA with negative biopsies. He is followed by urology. He has history of some erectile dysfunction. He had difficulty getting coverage for daily Cialis. He has used Viagra in the past which worked well. He thinks he may be developing some early BPH symptoms. He is hesitant to go on Flomax because of prior tendency toward orthostasis  He donates blood regularly and thus has been screened for hepatitis C and HIV. Colonoscopy up-to-date. Tetanus up-to-date.  Past Medical History:  Diagnosis Date  . Colon polyps   . History of colonic polyps 08/13/2004   Qualifier: Diagnosis of  By: Shane Purl MD, Shane Perkins Hypertension   . Low testosterone   . Wears contact lenses    Past Surgical History:  Procedure Laterality Date  . COLONOSCOPY    . PROSTATE SURGERY  2010   bx  . [place]    reports that  has never smoked. he has never used smokeless tobacco. He reports that he drinks alcohol. He reports that he does not use drugs. family history includes Colon cancer in his paternal uncle; Colon polyps in his brother; Heart disease in his mother; Hypertension in his mother. No Known Allergies   Review of Systems  Constitutional: Negative for chills and fever.  Respiratory: Negative for cough and shortness of breath.   Cardiovascular: Negative for chest pain.  Gastrointestinal: Negative for abdominal pain, nausea and vomiting.  Genitourinary: Negative for dysuria.  Neurological: Negative for headaches.  Hematological: Negative for adenopathy.       Objective:   Physical Exam  Constitutional: He appears well-developed and well-nourished.  Neck: Neck supple. No thyromegaly present.  Cardiovascular: Normal rate and regular rhythm. Exam reveals no  gallop.  Pulmonary/Chest: Effort normal and breath sounds normal. No respiratory distress. He has no wheezes. He has no rales.  Abdominal: Soft. He exhibits no distension and no mass. There is no tenderness. There is no rebound and no guarding.  Musculoskeletal: He exhibits no edema.       Assessment:     Physical exam. Following issues were addressed    Plan:     -Reviewed recent labs which were ordered for his upcoming urology follow-up including CBC, PSA, testosterone level. His PSA remains stable. No evidence for polycythemia -Immunizations up-to-date. -Consider new shingles vaccine in a couple of years. He had previous shingles vaccine 2015 -Prevnar 13 by age 54 -Continue regular exercise habits -Prescription for Viagra 100 mg one half to one tablet daily as needed  Shane Post MD [facility]

## 2016-12-08 ENCOUNTER — Other Ambulatory Visit: Payer: Self-pay | Admitting: Family Medicine

## 2016-12-08 MED FILL — CANDESARTAN CILEXETIL 32 MG: 32 | 90 days supply | Qty: 90 | Fill #0

## 2017-03-19 ENCOUNTER — Other Ambulatory Visit: Payer: Self-pay | Admitting: Family Medicine

## 2017-03-19 MED FILL — CANDESARTAN CILEXETIL 32 MG: 32 | 90 days supply | Qty: 90 | Fill #1

## 2017-03-20 NOTE — Telephone Encounter (Signed)
Refills OK. 

## 2017-03-23 MED FILL — TESTOSTERONE 20.25 MG/ACT (: 20.25 MG/AC | 90 days supply | Qty: 225 | Fill #0

## 2017-05-08 IMAGING — MR MR PROSTATE WO/W CM
23 of 56 series · 23 of 56 positions shown · IV contrast (yes)
Comparison: None.

EXAM:
MR PROSTATE WITHOUT AND WITH CONTRAST
TECHNIQUE: Multiplanar multisequence MRI images were obtained of the pelvis
centered about the prostate. Pre and post contrast images were
obtained.

CONTRAST:  18mL MULTIHANCE GADOBENATE DIMEGLUMINE 529 MG/ML IV SOLN

[Series 3: bSSFP fat-sat · axial · 6.0mm · 0.86mm/px · 1 of 44 slices shown]
[im 1/44]
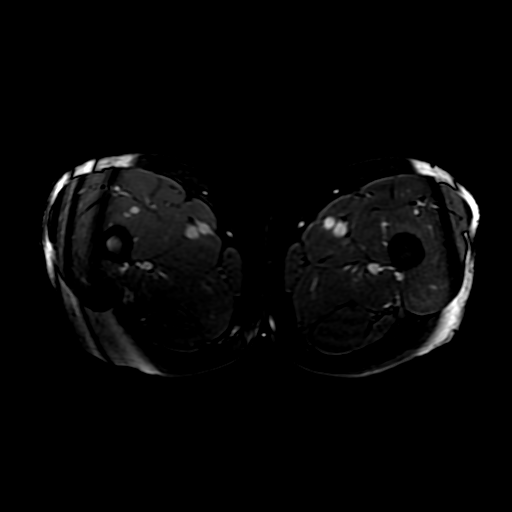

[Series 4: T1 · axial · 6.0mm · 0.86mm/px · 1 of 44 slices shown (1 of 2)]
[im 1/44]
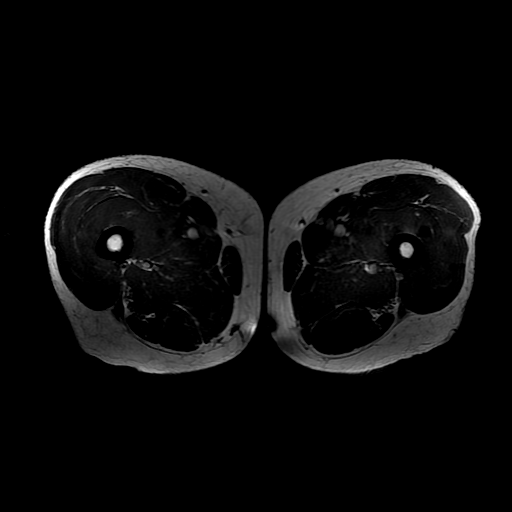

[Series 5: T2 · axial · 3.0mm · 0.29mm/px · 1 of 27 slices shown (1 of 4)]
[im 1/27]
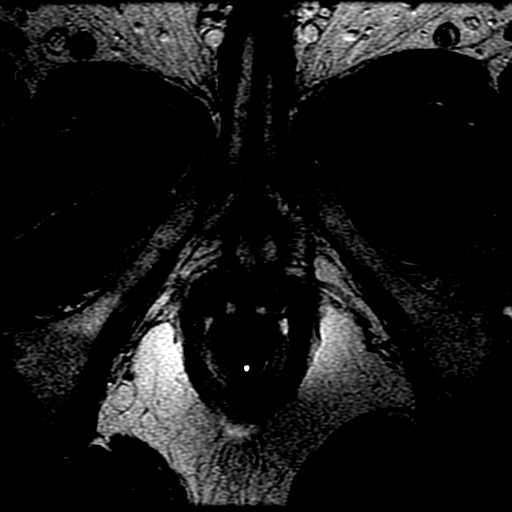

[Series 6: T1 · axial · 3.0mm · 0.29mm/px · 1 of 27 slices shown (2 of 2)]
[im 1/27]
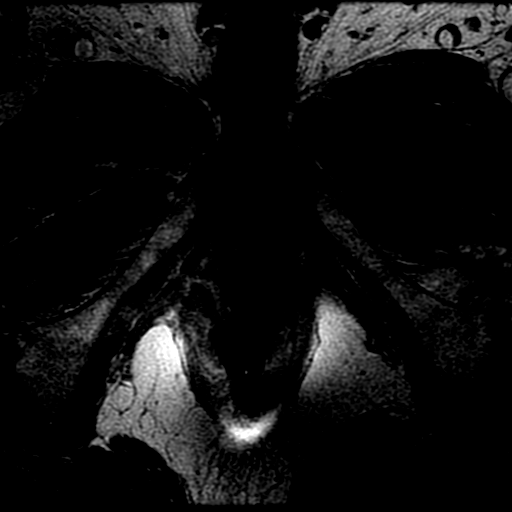

[Series 7: T2 · axial · 1.8mm · 0.47mm/px · 1 of 156 slices shown (2 of 4)]
[im 1/156]
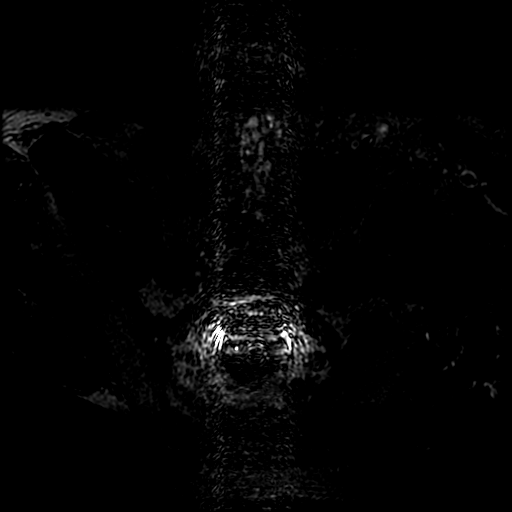

[Series 8: T2 · sagittal · 4.0mm · 0.29mm/px · 1 of 27 slices shown (3 of 4)]
[im 1/27]
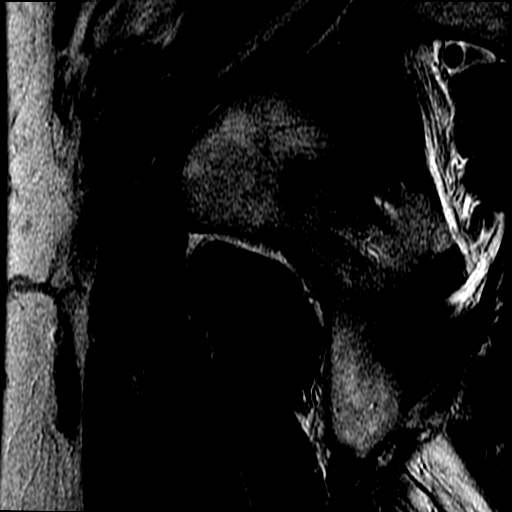

[Series 9: T2 · coronal · 4.0mm · 0.29mm/px · 1 of 24 slices shown (4 of 4)]
[im 1/24]
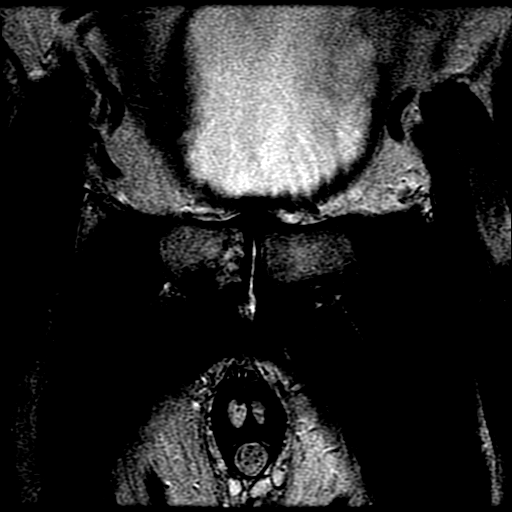

[Series 10: DWI · axial · 3.0mm · 0.59mm/px · 1 of 53 slices shown (1 of 6)]
[im 1/53]
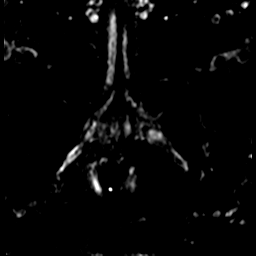

[Series 11: DWI · axial · 3.0mm · 0.59mm/px · 1 of 54 slices shown (2 of 6)]
[im 1/54]
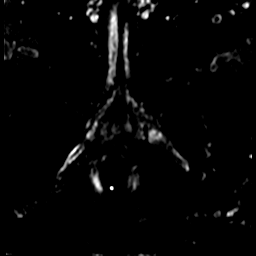

[Series 12: DWI · axial · 3.0mm · 0.59mm/px · 1 of 52 slices shown (3 of 6)]
[im 1/52]
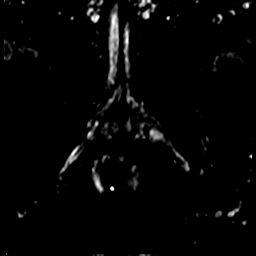

[Series 700: reformatted · axial · 1.8mm · 0.47mm/px · 1 of 64 slices shown (1 of 2)]
[im 1/64]
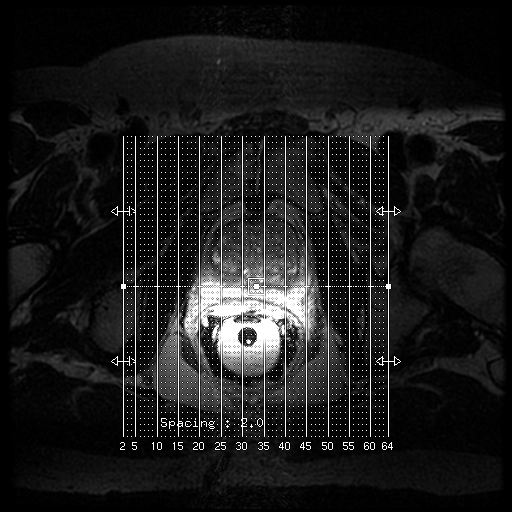

[Series 701: reformatted · coronal · 2.0mm · 0.39mm/px · 1 of 69 slices shown (2 of 2)]
[im 1/69]
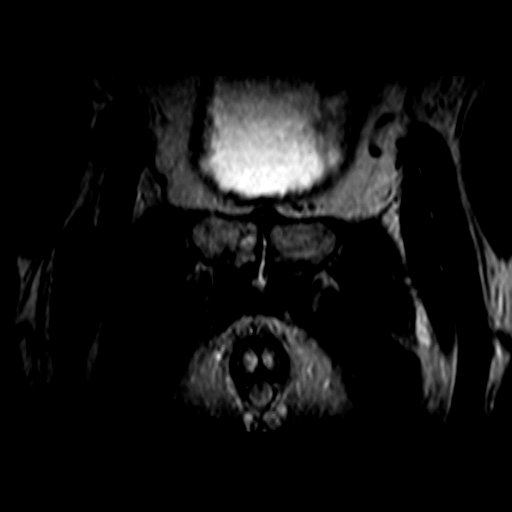

[Series 1000: DWI · axial · 3.0mm · 0.59mm/px · 1 of 27 slices shown (4 of 6)]
[im 1/27]
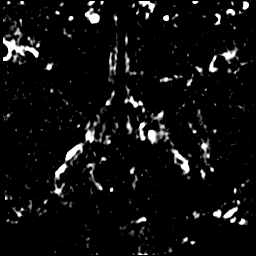

[Series 1100: DWI · axial · 3.0mm · 0.59mm/px · 1 of 27 slices shown (5 of 6)]
[im 1/27]
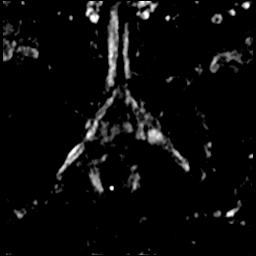

[Series 1200: DWI · axial · 3.0mm · 0.59mm/px · 1 of 27 slices shown (6 of 6)]
[im 1/27]
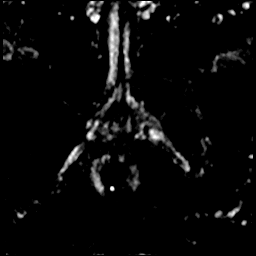

[((id)/(id)/1)-((id)/(id)/1) · axial · 3.0mm · 0.43mm/px · 1 of 73 slices shown (1 of 8)]
[im 1/73]
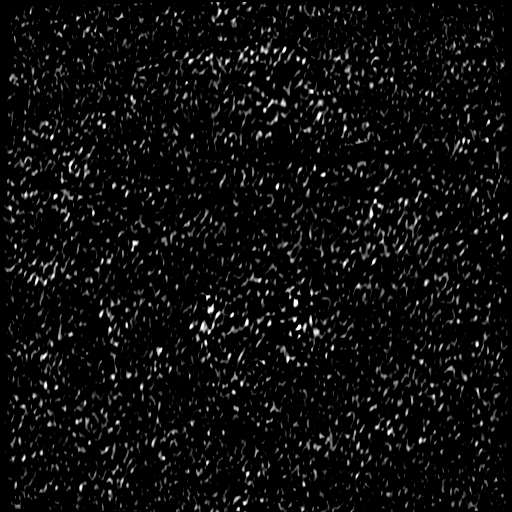

[((id)/(id)/1)-((id)/(id)/1) · axial · 3.0mm · 0.43mm/px · 1 of 76 slices shown (2 of 8)]
[im 1/76]
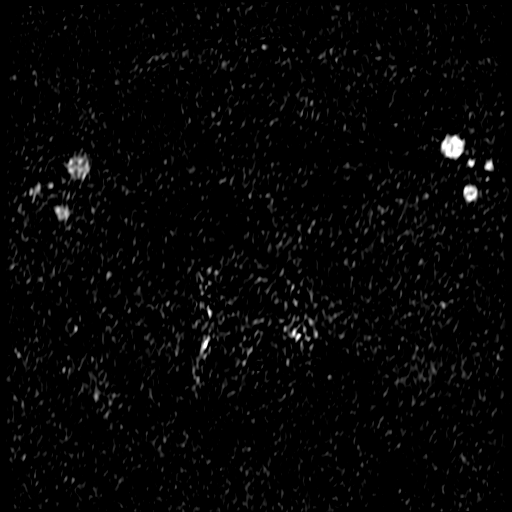

[((id)/(id)/1)-((id)/(id)/1) · axial · 3.0mm · 0.43mm/px · 1 of 76 slices shown (3 of 8)]
[im 1/76]
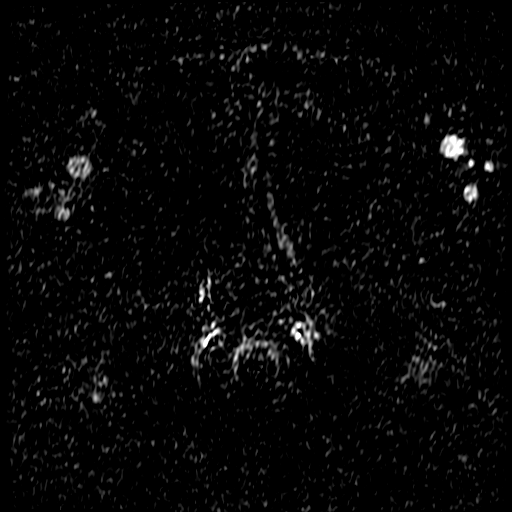

[((id)/(id)/1)-((id)/(id)/1) · axial · 3.0mm · 0.43mm/px · 1 of 76 slices shown (4 of 8)]
[im 1/76]
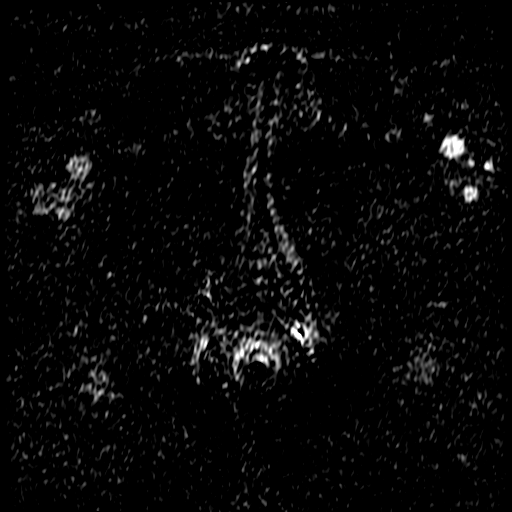

[((id)/(id)/1)-((id)/(id)/1) · axial · 3.0mm · 0.43mm/px · 1 of 76 slices shown (5 of 8)]
[im 1/76]
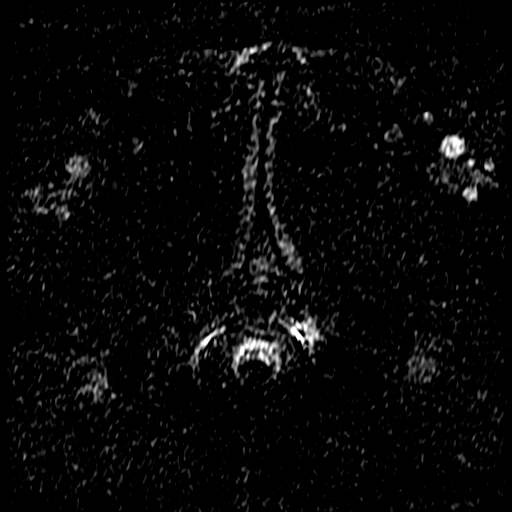

[((id)/(id)/1)-((id)/(id)/1) · axial · 3.0mm · 0.43mm/px · 1 of 76 slices shown (6 of 8)]
[im 1/76]
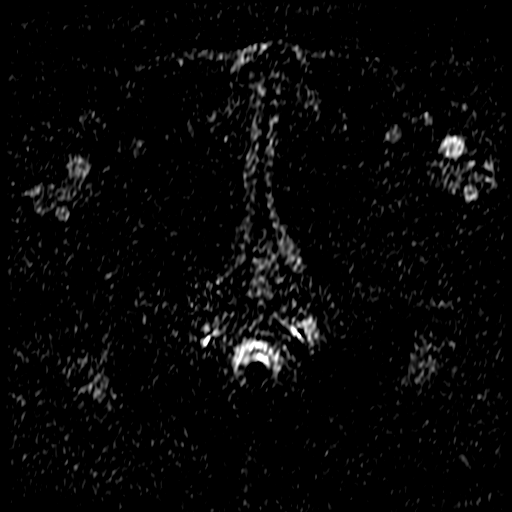

[((id)/(id)/1)-((id)/(id)/1) · axial · 3.0mm · 0.43mm/px · 1 of 76 slices shown (7 of 8)]
[im 1/76]
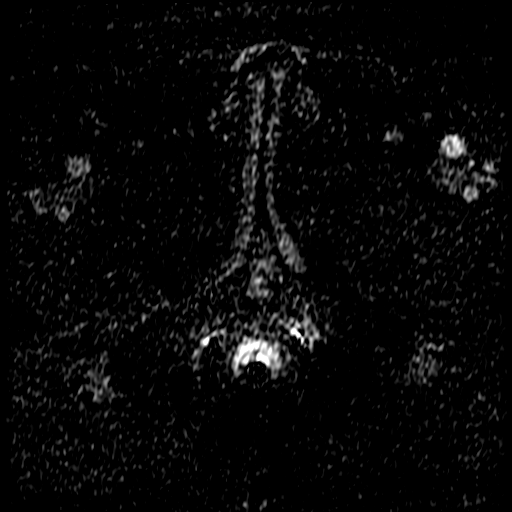

[((id)/(id)/1)-((id)/(id)/1) · axial · 3.0mm · 0.43mm/px · 1 of 76 slices shown (8 of 8)]
[im 1/76]
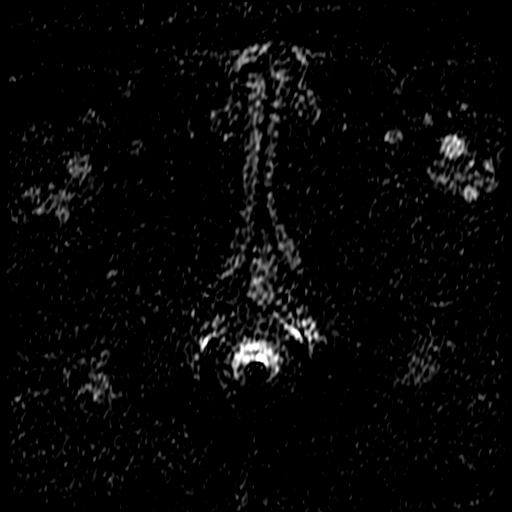

[23 of 56 positions shown; findings below may reference images not displayed]

FINDINGS: Prostate: The peripheral zone has normal high T2 signal with some
linear heads may be most consistent with stromal tissue. No foci of
restricted diffusion within the peripheral zone. No abnormal
enhancement.

The transitional zone is enlarged by large a encapsulated nodules.
The nodules are discrete with heterogeneous T2 signal. The prostatic
capsule is intact. Several vesicles are normal.

Transcapsular spread:  Absent

Seminal vesicle involvement: Absent

Neurovascular bundle involvement: Absent

Pelvic adenopathy: Absent

Bone metastasis: Absent

Other findings: Prostate measures 5.5 x 4.2 x 4.6 cm (volume = 56
cm^3)
IMPRESSION: 1. No evidence of high-grade prostate carcinoma.
2. Nodular transitional zone consistent with prostate hypertrophy.

## 2017-06-12 MED FILL — CANDESARTAN CILEXETIL 32 MG: 32 | 90 days supply | Qty: 90 | Fill #2

## 2017-07-06 DIAGNOSIS — J300 Vasomotor rhinitis: Secondary | ICD-10-CM | POA: Diagnosis not present

## 2017-08-27 MED FILL — CANDESARTAN CILEXETIL 32 MG: 32 | 90 days supply | Qty: 90 | Fill #3

## 2017-08-27 MED FILL — TESTOSTERONE 20.25 MG/ACT (: 20.25 MG/AC | 90 days supply | Qty: 225 | Fill #1

## 2017-11-27 ENCOUNTER — Encounter: Payer: Self-pay | Admitting: Family Medicine

## 2017-11-27 ENCOUNTER — Ambulatory Visit (INDEPENDENT_AMBULATORY_CARE_PROVIDER_SITE_OTHER): Payer: 59 | Admitting: Family Medicine

## 2017-11-27 ENCOUNTER — Other Ambulatory Visit: Payer: Self-pay

## 2017-11-27 VITALS — BP 122/82 | HR 72 | Temp 98.4°F | Ht 67.5 in | Wt 193.2 lb

## 2017-11-27 DIAGNOSIS — Z0000 Encounter for general adult medical examination without abnormal findings: Secondary | ICD-10-CM | POA: Diagnosis not present

## 2017-11-27 DIAGNOSIS — Z23 Encounter for immunization: Secondary | ICD-10-CM

## 2017-11-27 DIAGNOSIS — R7989 Other specified abnormal findings of blood chemistry: Secondary | ICD-10-CM

## 2017-11-27 LAB — CBC WITH DIFFERENTIAL/PLATELET
Basophils Absolute: 0 10*3/uL (ref 0.0–0.1)
Basophils Relative: 0.9 % (ref 0.0–3.0)
Eosinophils Absolute: 0.1 10*3/uL (ref 0.0–0.7)
Eosinophils Relative: 2.6 % (ref 0.0–5.0)
HCT: 43.2 % (ref 39.0–52.0)
Hemoglobin: 15.1 g/dL (ref 13.0–17.0)
Lymphocytes Relative: 42.7 % (ref 12.0–46.0)
Lymphs Abs: 1.9 10*3/uL (ref 0.7–4.0)
MCHC: 34.9 g/dL (ref 30.0–36.0)
MCV: 95 fl (ref 78.0–100.0)
Monocytes Absolute: 0.4 10*3/uL (ref 0.1–1.0)
Monocytes Relative: 10.1 % (ref 3.0–12.0)
Neutro Abs: 1.9 10*3/uL (ref 1.4–7.7)
Neutrophils Relative %: 43.7 % (ref 43.0–77.0)
Platelets: 219 10*3/uL (ref 150.0–400.0)
RBC: 4.54 Mil/uL (ref 4.22–5.81)
RDW: 12.6 % (ref 11.5–15.5)
WBC: 4.4 10*3/uL (ref 4.0–10.5)

## 2017-11-27 LAB — BASIC METABOLIC PANEL
BUN: 16 mg/dL (ref 6–23)
CO2: 30 mEq/L (ref 19–32)
Calcium: 9.6 mg/dL (ref 8.4–10.5)
Chloride: 99 mEq/L (ref 96–112)
Creatinine, Ser: 1.05 mg/dL (ref 0.40–1.50)
GFR: 75.19 mL/min (ref >60.00)
Glucose, Bld: 98 mg/dL (ref 70–99)
Potassium: 4.4 mEq/L (ref 3.5–5.1)
Sodium: 134 mEq/L — ABNORMAL LOW (ref 135–145)

## 2017-11-27 LAB — PSA: PSA: 5.7 ng/mL — ABNORMAL HIGH (ref 0.10–4.00)

## 2017-11-27 LAB — TESTOSTERONE: Testosterone: 336.5 ng/dL (ref 300.00–890.00)

## 2017-11-27 MED ORDER — TADALAFIL 5 MG PO TABS: 5.0000 mg | ORAL_TABLET | Freq: Every day | ORAL | 3 refills | Status: DC

## 2017-11-27 MED ORDER — CANDESARTAN CILEXETIL 32 MG PO TABS: 32.0000 mg | ORAL_TABLET | Freq: Every day | ORAL | 3 refills | Status: DC

## 2017-11-27 MED ORDER — TESTOSTERONE 20.25 MG/ACT (1.62%) TD GEL: TRANSDERMAL | 2 refills | Status: DC

## 2017-11-27 MED FILL — TESTOSTERONE 20.25 MG/ACT (: 20.25 MG/AC | 90 days supply | Qty: 225 | Fill #0

## 2017-11-27 MED FILL — CANDESARTAN CILEXETIL 32 MG: 32 | 90 days supply | Qty: 90 | Fill #0

## 2017-11-27 NOTE — Patient Instructions (Signed)
Remember shingrix booster in 2 to 6 months

## 2017-11-27 NOTE — Progress Notes (Signed)
Subjective:     Patient ID: Shane Perkins, male   DOB: [birth year], 67 y.o.   MRN: [id number]  HPI Patient is seen for physical exam.  He has history of elevated PSA, low testosterone, erectile dysfunction.  He is followed by urology.  He is due for follow-up labs.  Health maintenance reviewed.  Donates blood fairly regularly so hepatitis C has been screened previously.  Tetanus up-to-date.  Colonoscopy up-to-date.  No history of Prevnar 13.  He had Zostavax 2015.  Exercising regularly.  Generally feels well overall.  Still doing considerable amount of research in cardiology.  Past Medical History:  Diagnosis Date  . Colon polyps   . History of colonic polyps 08/13/2004   Qualifier: Diagnosis of  By: Carlean Purl MD, Dimas Millin Hypertension   . Low testosterone   . Wears contact lenses    Past Surgical History:  Procedure Laterality Date  . COLONOSCOPY    . HAMMERTOE RECONSTRUCTION WITH WEIL OSTEOTOMY Right 03/16/2014   Procedure: RIGHT SECOND/THIRD METATARSAL WEIL OSTEOSTOMY AND HAMMERTOE CORRECTION;  Surgeon: Wylene Simmer, MD;  Location: [place];  Service: Orthopedics;  Laterality: Right;  ANESTHESIA: GENERAL/REGIONAL BLOCK  . PROSTATE SURGERY  2010   bx  . [place]    reports that he has never smoked. He has never used smokeless tobacco. He reports that he drinks alcohol. He reports that he does not use drugs. family history includes Colon cancer in his paternal uncle; Colon polyps in his brother; Heart disease in his mother; Hypertension in his mother. No Known Allergies   Review of Systems  Constitutional: Negative for activity change, appetite change, fatigue and fever.  HENT: Negative for congestion and trouble swallowing.   Eyes: Negative for pain and visual disturbance.  Respiratory: Negative for cough, shortness of breath and wheezing.   Cardiovascular: Negative for chest pain and palpitations.  Gastrointestinal: Negative for abdominal  distention, abdominal pain, blood in stool, constipation, diarrhea, nausea, rectal pain and vomiting.  Genitourinary: Negative for dysuria and hematuria.  Musculoskeletal: Negative for arthralgias and joint swelling.  Skin: Negative for rash.  Neurological: Negative for dizziness, syncope and headaches.  Hematological: Negative for adenopathy.  Psychiatric/Behavioral: Negative for confusion and dysphoric mood.       Objective:   Physical Exam  Constitutional: He is oriented to person, place, and time. He appears well-developed and well-nourished. No distress.  HENT:  Head: Normocephalic and atraumatic.  Right Ear: External ear normal.  Left Ear: External ear normal.  Mouth/Throat: Oropharynx is clear and moist.  Eyes: Pupils are equal, round, and reactive to light. EOM are normal.  Neck: Normal range of motion. Neck supple. No thyromegaly present.  Cardiovascular: Normal rate, regular rhythm and normal heart sounds.  No murmur heard. Pulmonary/Chest: No respiratory distress. He has no wheezes. He has no rales.  Abdominal: Soft. Bowel sounds are normal. He exhibits no distension and no mass. There is no tenderness. There is no rebound and no guarding.  Musculoskeletal: He exhibits no edema.  Lymphadenopathy:    He has no cervical adenopathy.  Neurological: He is alert and oriented to person, place, and time. No cranial nerve deficit.  Skin: No rash noted.  Psychiatric: He has a normal mood and affect.       Assessment:     Physical exam.  The following health maintenance issues were addressed    Plan:     -Prevnar 13 given and recommend Pneumovax in 1 year -  Discussed Shingrix vaccine and he wished to go ahead and start that.  We will get booster in 2 to 6 months -Check labs with CBC, PSA, testosterone, basic metabolic panel -Continue with annual flu vaccine  [name] MD [facility] at [facility]

## 2017-11-29 ENCOUNTER — Encounter: Payer: Self-pay | Admitting: Family Medicine

## 2018-03-19 DIAGNOSIS — N138 Other obstructive and reflux uropathy: Secondary | ICD-10-CM | POA: Diagnosis not present

## 2018-03-19 DIAGNOSIS — R972 Elevated prostate specific antigen [PSA]: Secondary | ICD-10-CM | POA: Diagnosis not present

## 2018-03-19 DIAGNOSIS — E291 Testicular hypofunction: Secondary | ICD-10-CM | POA: Diagnosis not present

## 2018-03-19 DIAGNOSIS — N528 Other male erectile dysfunction: Secondary | ICD-10-CM | POA: Diagnosis not present

## 2018-03-19 DIAGNOSIS — N401 Enlarged prostate with lower urinary tract symptoms: Secondary | ICD-10-CM | POA: Diagnosis not present

## 2018-03-22 MED FILL — CANDESARTAN CILEXETIL 32 MG: 32 | 90 days supply | Qty: 90 | Fill #1 | Status: TO

## 2018-03-30 ENCOUNTER — Encounter: Payer: Self-pay | Admitting: Family Medicine

## 2018-03-31 ENCOUNTER — Other Ambulatory Visit: Payer: Self-pay

## 2018-03-31 ENCOUNTER — Encounter: Payer: Self-pay | Admitting: Family Medicine

## 2018-03-31 DIAGNOSIS — R6889 Other general symptoms and signs: Secondary | ICD-10-CM

## 2018-04-11 ENCOUNTER — Encounter: Payer: Self-pay | Admitting: Family Medicine

## 2018-04-13 ENCOUNTER — Telehealth: Payer: Self-pay

## 2018-04-13 NOTE — Telephone Encounter (Signed)
COVID-19 Negative

## 2018-04-13 NOTE — Telephone Encounter (Signed)
Called patient and gave him the message from Dr. Elease Hashimoto that his COVID19 test result is negative. Patient verbalized an understanding.

## 2018-04-15 LAB — NOVEL CORONAVIRUS, NAA: SARS-CoV-2, NAA: NOT DETECTED

## 2018-05-20 ENCOUNTER — Encounter: Payer: Self-pay | Admitting: Family Medicine

## 2018-05-24 MED FILL — TESTOSTERONE 20.25 MG/ACT (: 20.25 MG/AC | 90 days supply | Qty: 225 | Fill #0

## 2018-06-02 MED FILL — CANDESARTAN CILEXETIL 32 MG: 32 | 90 days supply | Qty: 90 | Fill #0

## 2018-09-07 DIAGNOSIS — J300 Vasomotor rhinitis: Secondary | ICD-10-CM | POA: Diagnosis not present

## 2018-09-07 MED FILL — DOXYCYCLINE HYC 100 MG CAPS: 100 | 10 days supply | Qty: 20 | Fill #0

## 2018-09-07 MED FILL — MUPIROCIN 2% OINTMENT: 2 | 14 days supply | Qty: 22 | Fill #0

## 2018-09-09 ENCOUNTER — Other Ambulatory Visit: Payer: Self-pay | Admitting: Family Medicine

## 2018-09-09 MED FILL — SILDENAFIL CITRATE 100 MG T: 100 | 30 days supply | Qty: 6 | Fill #0

## 2018-09-24 MED FILL — SILDENAFIL CITRATE 100 MG T: 100 | 30 days supply | Qty: 6 | Fill #0

## 2018-09-30 MED FILL — CANDESARTAN CILEXETIL 32 MG: 32 | 90 days supply | Qty: 90 | Fill #0

## 2018-10-05 ENCOUNTER — Other Ambulatory Visit: Payer: Self-pay | Admitting: Family Medicine

## 2018-10-08 NOTE — Telephone Encounter (Signed)
Last OV 11/27/2017, No future OV  Last filled 11/27/17, 225 g with 2 refills

## 2018-10-09 NOTE — Telephone Encounter (Signed)
I refilled testosterone, but remind him we are due to follow up and labs- CBC, PSA, testosterone levels.

## 2018-10-11 MED FILL — TESTOSTERONE 20.25 MG/ACT (: 20.25 MG/AC | 90 days supply | Qty: 225 | Fill #0

## 2018-12-28 ENCOUNTER — Other Ambulatory Visit: Payer: Self-pay | Admitting: Family Medicine

## 2018-12-28 MED FILL — CANDESARTAN CILEXETIL 32 MG: 32 | 30 days supply | Qty: 30 | Fill #0

## 2018-12-28 NOTE — Telephone Encounter (Signed)
Please refill enough until he can get in for follow up.

## 2019-01-04 ENCOUNTER — Encounter: Payer: Self-pay | Admitting: Family Medicine

## 2019-01-04 ENCOUNTER — Ambulatory Visit (INDEPENDENT_AMBULATORY_CARE_PROVIDER_SITE_OTHER): Payer: 59 | Admitting: Family Medicine

## 2019-01-04 ENCOUNTER — Other Ambulatory Visit: Payer: Self-pay

## 2019-01-04 VITALS — BP 142/82 | HR 74 | Temp 97.9°F | Ht 67.25 in | Wt 184.1 lb

## 2019-01-04 DIAGNOSIS — Z0000 Encounter for general adult medical examination without abnormal findings: Secondary | ICD-10-CM | POA: Diagnosis not present

## 2019-01-04 DIAGNOSIS — R7989 Other specified abnormal findings of blood chemistry: Secondary | ICD-10-CM

## 2019-01-04 NOTE — Progress Notes (Signed)
Subjective:     Patient ID: Shane Perkins, male   DOB: November 21, 1950, 68 y.o.   MRN: [id number]  HPI   Shane Perkins is here for physical exam.  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  He has history of hypertension treated with candesartan.  He has history of low testosterone and is followed by urology.  He is on topical testosterone replacement.  Needs follow-up labs.  He does have history of chronic elevated PSA and again has been thoroughly evaluated by urology for that.  He just received Covid vaccine couple days ago.  He does need Pneumovax but would like to defer at this time.  Flu vaccine already given. Tetanus due 2025. Colonoscopy due 2022  Past Medical History:  Diagnosis Date  . Colon polyps   . History of colonic polyps 08/13/2004   Qualifier: Diagnosis of  By: Carlean Purl MD, Dimas Millin Hypertension   . Low testosterone   . Wears contact lenses    Past Surgical History:  Procedure Laterality Date  . COLONOSCOPY    . HAMMERTOE RECONSTRUCTION WITH WEIL OSTEOTOMY Right 03/16/2014   Procedure: RIGHT SECOND/THIRD METATARSAL WEIL OSTEOSTOMY AND HAMMERTOE CORRECTION;  Surgeon: Wylene Simmer, MD;  Location: [place];  Service: Orthopedics;  Laterality: Right;  ANESTHESIA: GENERAL/REGIONAL BLOCK  . PROSTATE SURGERY  2010   bx  . Shane Perkins    reports that he has never smoked. He has never used smokeless tobacco. He reports current alcohol use. He reports that he does not use drugs. family history includes Colon cancer in his paternal uncle; Colon polyps in his brother; Heart disease in his mother; Hypertension in his mother. No Known Allergies   Review of Systems  Constitutional: Negative for activity change, appetite change, fatigue and fever.  HENT: Negative for congestion, ear pain and trouble swallowing.   Eyes: Negative for pain and visual disturbance.  Respiratory: Negative for cough, shortness of  breath and wheezing.   Cardiovascular: Negative for chest pain and palpitations.  Gastrointestinal: Negative for abdominal distention, abdominal pain, blood in stool, constipation, diarrhea, nausea, rectal pain and vomiting.  Genitourinary: Negative for dysuria, hematuria and testicular pain.  Musculoskeletal: Negative for arthralgias and joint swelling.  Skin: Negative for rash.  Neurological: Negative for dizziness, syncope and headaches.  Hematological: Negative for adenopathy.  Psychiatric/Behavioral: Negative for confusion and dysphoric mood.       Objective:   Physical Exam Constitutional:      General: He is not in acute distress.    Appearance: He is well-developed.  HENT:     Head: Normocephalic and atraumatic.     Right Ear: External ear normal.     Left Ear: External ear normal.  Eyes:     Conjunctiva/sclera: Conjunctivae normal.     Pupils: Pupils are equal, round, and reactive to light.  Neck:     Thyroid: No thyromegaly.  Cardiovascular:     Rate and Rhythm: Normal rate and regular rhythm.     Heart sounds: Normal heart sounds. No murmur.  Pulmonary:     Effort: No respiratory distress.     Breath sounds: No wheezing or rales.  Abdominal:     General: Bowel sounds are normal. There is no distension.     Palpations: Abdomen is soft. There is no mass.     Tenderness: There is no abdominal tenderness. There is no guarding or rebound.  Musculoskeletal:  Cervical back: Normal range of motion and neck supple.  Lymphadenopathy:     Cervical: No cervical adenopathy.  Skin:    Findings: No rash.  Neurological:     Mental Status: He is alert and oriented to person, place, and time.     Cranial Nerves: No cranial nerve deficit.     Deep Tendon Reflexes: Reflexes normal.        Assessment:     Physical exam.  We discussed the following health maintenance issues    Plan:     -Needs follow-up labs and PSA, CBC, total testosterone level, and basic metabolic  panel obtained -Monitor blood pressure and be in touch if consistently elevated above recommended goals for age -Recommend Pneumovax but he wishes to defer as he just got Covid vaccination -He had 1 previous Shingrix but did not get the second 1.  He will consider whether to reinitiate Shingrix vaccine series  [name] MD [facility] at [facility]

## 2019-01-05 ENCOUNTER — Encounter: Payer: Self-pay | Admitting: Family Medicine

## 2019-01-05 LAB — CBC WITH DIFFERENTIAL/PLATELET
Basophils Absolute: 0 10*3/uL (ref 0.0–0.1)
Basophils Relative: 1.1 % (ref 0.0–3.0)
Eosinophils Absolute: 0.1 10*3/uL (ref 0.0–0.7)
Eosinophils Relative: 2.9 % (ref 0.0–5.0)
HCT: 41.9 % (ref 39.0–52.0)
Hemoglobin: 14.2 g/dL (ref 13.0–17.0)
Lymphocytes Relative: 42.8 % (ref 12.0–46.0)
Lymphs Abs: 1.7 10*3/uL (ref 0.7–4.0)
MCHC: 34 g/dL (ref 30.0–36.0)
MCV: 97.2 fl (ref 78.0–100.0)
Monocytes Absolute: 0.5 10*3/uL (ref 0.1–1.0)
Monocytes Relative: 13.6 % — ABNORMAL HIGH (ref 3.0–12.0)
Neutro Abs: 1.5 10*3/uL (ref 1.4–7.7)
Neutrophils Relative %: 39.6 % — ABNORMAL LOW (ref 43.0–77.0)
Platelets: 195 10*3/uL (ref 150.0–400.0)
RBC: 4.31 Mil/uL (ref 4.22–5.81)
RDW: 13 % (ref 11.5–15.5)
WBC: 3.9 10*3/uL — ABNORMAL LOW (ref 4.0–10.5)

## 2019-01-05 LAB — BASIC METABOLIC PANEL
BUN: 20 mg/dL (ref 6–23)
CO2: 26 mEq/L (ref 19–32)
Calcium: 9.1 mg/dL (ref 8.4–10.5)
Chloride: 100 mEq/L (ref 96–112)
Creatinine, Ser: 1.03 mg/dL (ref 0.40–1.50)
GFR: 72.09 mL/min (ref >60.00)
Glucose, Bld: 86 mg/dL (ref 70–99)
Potassium: 4.3 mEq/L (ref 3.5–5.1)
Sodium: 134 mEq/L — ABNORMAL LOW (ref 135–145)

## 2019-01-05 LAB — PSA: PSA: 4.68 ng/mL — ABNORMAL HIGH (ref 0.10–4.00)

## 2019-01-05 LAB — TESTOSTERONE: Testosterone: 419.05 ng/dL (ref 300.00–890.00)

## 2019-01-24 ENCOUNTER — Other Ambulatory Visit: Payer: Self-pay | Admitting: Family Medicine

## 2019-01-24 MED FILL — CANDESARTAN CILEXETIL 32 MG: 32 | 90 days supply | Qty: 90 | Fill #0

## 2019-03-02 MED FILL — TESTOSTERONE 20.25 MG/ACT (: 20.25 MG/AC | 90 days supply | Qty: 225 | Fill #1

## 2019-05-02 MED FILL — CANDESARTAN CILEXETIL 32 MG: 32 | 90 days supply | Qty: 90 | Fill #1

## 2019-07-08 ENCOUNTER — Other Ambulatory Visit: Payer: Self-pay | Admitting: Family Medicine

## 2019-07-08 NOTE — Telephone Encounter (Signed)
Please advise 

## 2019-07-11 MED FILL — TESTOSTERONE 20.25 MG/ACT (: 20.25 MG/AC | 90 days supply | Qty: 225 | Fill #0

## 2019-07-25 DIAGNOSIS — R972 Elevated prostate specific antigen [PSA]: Secondary | ICD-10-CM | POA: Diagnosis not present

## 2019-07-25 DIAGNOSIS — E291 Testicular hypofunction: Secondary | ICD-10-CM | POA: Diagnosis not present

## 2019-07-25 DIAGNOSIS — N138 Other obstructive and reflux uropathy: Secondary | ICD-10-CM | POA: Diagnosis not present

## 2019-07-25 DIAGNOSIS — N401 Enlarged prostate with lower urinary tract symptoms: Secondary | ICD-10-CM | POA: Diagnosis not present

## 2019-07-25 DIAGNOSIS — N529 Male erectile dysfunction, unspecified: Secondary | ICD-10-CM | POA: Diagnosis not present

## 2019-07-29 ENCOUNTER — Other Ambulatory Visit: Payer: Self-pay | Admitting: Family Medicine

## 2019-07-29 MED FILL — CANDESARTAN CILEXETIL 32 MG: 32 | 90 days supply | Qty: 90 | Fill #0

## 2019-08-08 ENCOUNTER — Ambulatory Visit: Payer: 59 | Admitting: Family Medicine

## 2019-08-08 ENCOUNTER — Encounter: Payer: Self-pay | Admitting: Family Medicine

## 2019-08-08 ENCOUNTER — Other Ambulatory Visit: Payer: Self-pay

## 2019-08-08 VITALS — BP 122/68 | HR 54 | Temp 98.5°F | Wt 180.1 lb

## 2019-08-08 DIAGNOSIS — R1013 Epigastric pain: Secondary | ICD-10-CM

## 2019-08-08 NOTE — Progress Notes (Signed)
Established Patient Office Visit  Subjective:  Patient ID: Shane Perkins, male    DOB: January 06, 1951  Age: 69 y.o. MRN: [id number]  CC:  Chief Complaint  Patient presents with  . Abdominal Pain    for about 3 weeks in the center of his abdomen     HPI Shane Perkins presents for onset a few weeks ago of some epigastric discomfort.  His symptoms were epigastric location and without radiation.  Denied any active GERD symptoms.  He took Tums on a couple occasions and after both occasions noted itching of the hands as well as some facial swelling and inspiratory stridor.  On the second episode he ended up going to the ER but did not actually seek care.  His symptoms eventually improved.  He wonders if he had a reaction to type of red dye.  He plans to avoid TUMS (and red dye) completely at this point.  He scaled back coffee consumption and has noted improvement and basically resolution of abdominal symptoms since then.  He also related he had been doing lots of set ups but his pain was not reproducible by movement.  This did not really sound musculoskeletal in origin.  No recent vomiting, change of appetite, unexplained weight loss, melena.  No localized right upper quadrant pain.  No exertional symptoms.  No chest pain.  Past Medical History:  Diagnosis Date  . Colon polyps   . History of colonic polyps 08/13/2004   Qualifier: Diagnosis of  By: Carlean Purl MD, Dimas Millin Hypertension   . Low testosterone   . Wears contact lenses     Past Surgical History:  Procedure Laterality Date  . COLONOSCOPY    . HAMMERTOE RECONSTRUCTION WITH WEIL OSTEOTOMY Right 03/16/2014   Procedure: RIGHT SECOND/THIRD METATARSAL WEIL OSTEOSTOMY AND HAMMERTOE CORRECTION;  Surgeon: Wylene Simmer, MD;  Location: [facility];  Service: Orthopedics;  Laterality: Right;  ANESTHESIA: GENERAL/REGIONAL BLOCK  . PROSTATE SURGERY  2010   bx  . VARIOCOCELE REPAIR  1985    Family History  Problem  Relation Age of Onset  . Heart disease Mother   . Hypertension Mother   . Colon polyps Brother   . Colon cancer Paternal Uncle        possible.  not sure  . Esophageal cancer Neg Hx   . Pancreatic cancer Neg Hx   . Prostate cancer Neg Hx   . Rectal cancer Neg Hx   . Stomach cancer Neg Hx     Social History   Socioeconomic History  . Marital status: Married    Spouse name: Not on file  . Number of children: Not on file  . Years of education: Not on file  . Highest education level: Not on file  Occupational History  . Not on file  Tobacco Use  . Smoking status: Never Smoker  . Smokeless tobacco: Never Used  Vaping Use  . Vaping Use: Never used  Substance and Sexual Activity  . Alcohol use: Yes    Comment: occ. 2 glasses wine month per pt  . Drug use: No  . Sexual activity: Not on file  Other Topics Concern  . Not on file  Social History Narrative  . Not on file   Social Determinants of Health   Financial Resource Strain:   . Difficulty of Paying Living Expenses:   Food Insecurity:   . Worried About Charity fundraiser in the Last Year:   .  Ran Out of Food in the Last Year:   Transportation Needs:   . Film/video editor (Medical):   Marland Kitchen Lack of Transportation (Non-Medical):   Physical Activity:   . Days of Exercise per Week:   . Minutes of Exercise per Session:   Stress:   . Feeling of Stress :   Social Connections:   . Frequency of Communication with Friends and Family:   . Frequency of Social Gatherings with Friends and Family:   . Attends Religious Services:   . Active Member of Clubs or Organizations:   . Attends Archivist Meetings:   Marland Kitchen Marital Status:   Intimate Partner Violence:   . Fear of Current or Ex-Partner:   . Emotionally Abused:   Marland Kitchen Physically Abused:   . Sexually Abused:     Outpatient Medications Prior to Visit  Medication Sig Dispense Refill  . candesartan (ATACAND) 32 MG tablet TAKE 1 TABLET (32 MG TOTAL) BY MOUTH DAILY.  90 tablet 1  . sildenafil (VIAGRA) 100 MG tablet TAKE 1/2 TO 1 TABLET BY MOUTH DAILY AS NEEDED FOR ERECTILE DYSFUNCTION 10 tablet 11  . tadalafil (CIALIS) 5 MG tablet Take 1 tablet (5 mg total) by mouth daily. 90 tablet 3  . Testosterone 20.25 MG/ACT (1.62%) GEL APPLY 1 PUMP TO EACH ARM ONCE DAILY AS DIRECTED 225 g 1   Facility-Administered Medications Prior to Visit  Medication Dose Route Frequency Provider Last Rate Last Admin  . 0.9 %  sodium chloride infusion  500 mL Intravenous Continuous Gatha Mayer, MD        Allergies  Allergen Reactions  . Red Dye Anaphylaxis    ROS Review of Systems  Constitutional: Negative for appetite change, chills, fever and unexpected weight change.  HENT: Negative for trouble swallowing.   Respiratory: Negative for cough and shortness of breath.   Cardiovascular: Negative for chest pain.  Gastrointestinal:       See HPI      Objective:    Physical Exam Vitals reviewed.  Constitutional:      Appearance: He is well-developed.  Cardiovascular:     Rate and Rhythm: Normal rate and regular rhythm.  Pulmonary:     Effort: Pulmonary effort is normal.     Breath sounds: Normal breath sounds.  Abdominal:     General: Bowel sounds are normal.     Palpations: Abdomen is soft. There is no hepatomegaly, splenomegaly or mass.     Tenderness: There is no abdominal tenderness.  Neurological:     Mental Status: He is alert.     BP 122/68 (BP Location: Left Arm, Patient Position: Sitting, Cuff Size: Normal)   Pulse 54   Temp 98.5 F (36.9 C) (Oral)   Wt 180 lb 1.6 oz (81.7 kg)   SpO2 95%   BMI 28.00 kg/m  Wt Readings from Last 3 Encounters:  08/08/19 180 lb 1.6 oz (81.7 kg)  01/04/19 184 lb 1.6 oz (83.5 kg)  11/27/17 193 lb 3.2 oz (87.6 kg)     Health Maintenance Due  Topic Date Due  . Hepatitis C Screening  Never done  . COVID-19 Vaccine (1) Never done  . PNA vac Low Risk Adult (2 of 2 - PPSV23) 11/28/2018    There are no  preventive care reminders to display for this patient.  Lab Results  Component Value Date   TSH 2.12 07/11/2013   Lab Results  Component Value Date   WBC 3.9 (L) 01/04/2019   HGB  14.2 01/04/2019   HCT 41.9 01/04/2019   MCV 97.2 01/04/2019   PLT 195.0 01/04/2019   Lab Results  Component Value Date   NA 134 (L) 01/04/2019   K 4.3 01/04/2019   CO2 26 01/04/2019   GLUCOSE 86 01/04/2019   BUN 20 01/04/2019   CREATININE 1.03 01/04/2019   BILITOT 0.4 04/13/2015   ALKPHOS 42 04/13/2015   AST 16 04/13/2015   ALT 15 04/13/2015   PROT 6.8 04/13/2015   ALBUMIN 4.1 04/13/2015   CALCIUM 9.1 01/04/2019   ANIONGAP 7 03/15/2014   GFR 72.09 01/04/2019   Lab Results  Component Value Date   CHOL 149 03/16/2014   Lab Results  Component Value Date   HDL 40 03/16/2014   Lab Results  Component Value Date   LDLCALC 74 03/16/2014   Lab Results  Component Value Date   TRIG 177 (A) 03/16/2014   Lab Results  Component Value Date   CHOLHDL 4 04/08/2012   No results found for: HGBA1C    Assessment & Plan:   Recent transient epigastric pain which is resolved at this time after elimination of coffee/caffeine.  He describes probable allergic reaction following Tums- probably related to some type of dye.  Has had no recurrent symptoms since then.  Does not have any red flags such as change in appetite, unexplained weight loss, vomiting, melena  -Follow-up promptly for any recurrent symptoms -Consider over-the-counter Pepcid 20 mg for any further heartburn or dyspepsia symptoms -Low threshold to consider ultrasound and lab work for any recurrent symptoms  No orders of the defined types were placed in this encounter.   Follow-up: No follow-ups on file.    Carolann Littler, MD

## 2019-10-04 ENCOUNTER — Other Ambulatory Visit: Payer: Self-pay | Admitting: Family Medicine

## 2019-10-04 MED FILL — SILDENAFIL CITRATE 100 MG T: 100 | 30 days supply | Qty: 6 | Fill #0

## 2019-10-07 MED FILL — TESTOSTERONE 20.25 MG/ACT (: 20.25 MG/AC | 90 days supply | Qty: 225 | Fill #1

## 2019-10-31 MED FILL — CANDESARTAN CILEXETIL 32 MG: 32 | 90 days supply | Qty: 90 | Fill #1

## 2019-12-28 ENCOUNTER — Ambulatory Visit (INDEPENDENT_AMBULATORY_CARE_PROVIDER_SITE_OTHER): Payer: 59 | Admitting: Dermatology

## 2019-12-28 ENCOUNTER — Encounter: Payer: Self-pay | Admitting: Dermatology

## 2019-12-28 ENCOUNTER — Other Ambulatory Visit: Payer: Self-pay

## 2019-12-28 DIAGNOSIS — D485 Neoplasm of uncertain behavior of skin: Secondary | ICD-10-CM

## 2019-12-28 DIAGNOSIS — D3617 Benign neoplasm of peripheral nerves and autonomic nervous system of trunk, unspecified: Secondary | ICD-10-CM | POA: Diagnosis not present

## 2019-12-28 DIAGNOSIS — Z872 Personal history of diseases of the skin and subcutaneous tissue: Secondary | ICD-10-CM | POA: Diagnosis not present

## 2019-12-28 DIAGNOSIS — Z1283 Encounter for screening for malignant neoplasm of skin: Secondary | ICD-10-CM | POA: Diagnosis not present

## 2019-12-28 NOTE — Patient Instructions (Signed)

## 2020-01-01 ENCOUNTER — Encounter: Payer: Self-pay | Admitting: Dermatology

## 2020-01-01 NOTE — Progress Notes (Signed)
   New Patient   Subjective  Shane Perkins is a 69 y.o. male who presents for the following: Annual Exam (Last skin check 2017 only history seb k on eyelid 2013).  General skin examination Location: Wife concerned with growth of lesion right shoulder blade. Duration:  Quality:  Associated Signs/Symptoms: Modifying Factors:  Severity:  Timing: Context:    The following portions of the chart were reviewed this encounter and updated as appropriate:  Tobacco  Allergies  Meds  Problems  Med Hx  Surg Hx  Fam Hx      Objective  Well appearing patient in no apparent distress; mood and affect are within normal limits. Objective  Chest - Medial [facility]): Head to toe skin examination, no atypical moles or melanoma.  Objective  Right Upper Back: Pink pearly pedunculated 4 mm papule     Objective  Left Occipital Scalp: Pink pearly centrally ulcerated 3 mm papule      A full examination was performed including scalp, head, eyes, ears, nose, lips, neck, chest, axillae, abdomen, back, buttocks, bilateral upper extremities, bilateral lower extremities, hands, feet, fingers, toes, fingernails, and toenails. All findings within normal limits unless otherwise noted below.   Assessment & Plan  Screening exam for skin cancer Chest - Medial [facility])  Self examined skin with wife Investment banker, corporate) twice annually.  Dermatologist every 1 to 2 years or as needed change.  Neoplasm of uncertain behavior of skin (2) Right Upper Back  Skin / nail biopsy Type of biopsy: tangential   Informed consent: discussed and consent obtained   Timeout: patient name, date of birth, surgical site, and procedure verified   Procedure prep:  Patient was prepped and draped in usual sterile fashion (Non sterile) Prep type:  Chlorhexidine Anesthesia: the lesion was anesthetized in a standard fashion   Anesthetic:  1% lidocaine w/ epinephrine 1-100,000 local infiltration Instrument used: flexible razor blade    Outcome: patient tolerated procedure well   Post-procedure details: wound care instructions given    Specimen 1 - Surgical pathology Differential Diagnosis: r/o DF  Check Margins: No  Left Occipital Scalp  Skin / nail biopsy Type of biopsy: tangential   Informed consent: discussed and consent obtained   Timeout: patient name, date of birth, surgical site, and procedure verified   Procedure prep:  Patient was prepped and draped in usual sterile fashion (Non sterile) Prep type:  Chlorhexidine Anesthesia: the lesion was anesthetized in a standard fashion   Anesthetic:  1% lidocaine w/ epinephrine 1-100,000 local infiltration Instrument used: flexible razor blade   Outcome: patient tolerated procedure well   Post-procedure details: wound care instructions given    Specimen 2 - Surgical pathology Differential Diagnosis: bcc vs scc  Check Margins: No

## 2020-01-10 ENCOUNTER — Telehealth: Payer: Self-pay | Admitting: Dermatology

## 2020-01-10 NOTE — Telephone Encounter (Signed)
Patient left message on office voice mail saying that he was calling for his pathology results and an appointment for removal of the skin cancer.

## 2020-01-10 NOTE — Telephone Encounter (Signed)
Patients path looks like it has not been reviewed. Can you please review patient is calling for results?

## 2020-01-11 NOTE — Telephone Encounter (Signed)
Phone call to patient to let him know we can get him an appointment next week to have skin cancer treated. Made an appointment 01/17/2020 @7  with Dr.Tafeen. Patient is aware of his path results too.

## 2020-01-11 NOTE — Telephone Encounter (Signed)
I am most concerned that this result did not populate my results pile which is why it was not reviewed.  Please let Dr. Riley Kill know that I will likely treat this with a simple 3 stitch excision and this can be scheduled whenever mutually appropriate.  I am willing to see him on a Friday morning.

## 2020-01-17 ENCOUNTER — Encounter: Payer: Self-pay | Admitting: Dermatology

## 2020-01-17 ENCOUNTER — Ambulatory Visit (INDEPENDENT_AMBULATORY_CARE_PROVIDER_SITE_OTHER): Payer: 59 | Admitting: Dermatology

## 2020-01-17 ENCOUNTER — Other Ambulatory Visit: Payer: Self-pay

## 2020-01-17 DIAGNOSIS — D099 Carcinoma in situ, unspecified: Secondary | ICD-10-CM

## 2020-01-17 DIAGNOSIS — L570 Actinic keratosis: Secondary | ICD-10-CM | POA: Diagnosis not present

## 2020-01-17 NOTE — Patient Instructions (Signed)

## 2020-01-17 NOTE — Progress Notes (Addendum)
   Follow-Up Visit   Subjective  Shane Perkins is a 70 y.o. male who presents for the following: Procedure (SCC SCALP).  Biopsy indicates at least a thick precancer on his scalp with underlying squamous cell carcinoma not excluded. Location: Back left scalp Duration:  Quality:  Associated Signs/Symptoms: Modifying Factors:  Severity:  Timing: Context:   Objective  Well appearing patient in no apparent distress; mood and affect are within normal limits. Objective  Mid Occipital Scalp: Biopsy site identified.  Nurse and by me.  Subtle residual nonpalpable 3 mm pink scale.  Before proceeding, I discussed with Dr. Riley Kill the option of simple observation (original biopsy was nondiagnostic of skin cancer), elliptical excision (advantage of obtaining a second microscopic with margins but likely more procedure than necessary) or simply treating the biopsy site with curettage, cautery, fluorouracil inoculation.  We mutually chose the last of these options which was performed without problem.  Curettage did not show obvious depth or width.   A focused examination was performed including Head and neck.. Relevant physical exam findings are noted in the Assessment and Plan.   Assessment & Plan    AK (actinic keratosis) Mid Occipital Scalp  Biopsy indicates that this is at least a thick precancerous keratosis and underlying squamous cell carcinoma cannot be excluded.  Therefore this was treated by curettage plus electrocautery.  Destruction of lesion - Mid Occipital Scalp Complexity: simple   Destruction method: electrodesiccation and curettage   Informed consent: discussed and consent obtained   Timeout:  patient name, date of birth, surgical site, and procedure verified Anesthesia: the lesion was anesthetized in a standard fashion   Anesthetic:  1% lidocaine w/ epinephrine 1-100,000 local infiltration Curettage performed in three different directions: Yes   Curettage cycles:   3 Hemostasis achieved with:  ferric subsulfate Outcome: patient tolerated procedure well with no complications   Post-procedure details: sterile dressing applied and wound care instructions given   Dressing type: bandage and petrolatum   Additional details:  Wound inoculated with 5% Fluorouracil.    Curette plus cautery x3, inoculated base with parenteral 5-FU.   I, Janalyn Harder, MD, have reviewed all documentation for this visit.  The documentation on 02/07/20 for the exam, diagnosis, procedures, and orders are all accurate and complete.

## 2020-01-21 ENCOUNTER — Encounter: Payer: Self-pay | Admitting: Dermatology

## 2020-01-30 ENCOUNTER — Other Ambulatory Visit: Payer: Self-pay | Admitting: Family Medicine

## 2020-01-30 ENCOUNTER — Ambulatory Visit: Payer: 59 | Admitting: Family Medicine

## 2020-02-01 ENCOUNTER — Ambulatory Visit: Payer: 59 | Admitting: Family Medicine

## 2020-02-01 ENCOUNTER — Encounter: Payer: Self-pay | Admitting: Family Medicine

## 2020-02-01 ENCOUNTER — Other Ambulatory Visit: Payer: Self-pay

## 2020-02-01 VITALS — BP 134/84 | HR 57 | Ht 67.25 in | Wt 185.0 lb

## 2020-02-01 DIAGNOSIS — R931 Abnormal findings on diagnostic imaging of heart and coronary circulation: Secondary | ICD-10-CM

## 2020-02-01 DIAGNOSIS — R7989 Other specified abnormal findings of blood chemistry: Secondary | ICD-10-CM | POA: Diagnosis not present

## 2020-02-01 LAB — CBC WITH DIFFERENTIAL/PLATELET
Basophils Absolute: 0 10*3/uL (ref 0.0–0.1)
Basophils Relative: 0.6 % (ref 0.0–3.0)
Eosinophils Absolute: 0 10*3/uL (ref 0.0–0.7)
Eosinophils Relative: 0.8 % (ref 0.0–5.0)
HCT: 43.9 % (ref 39.0–52.0)
Hemoglobin: 15 g/dL (ref 13.0–17.0)
Lymphocytes Relative: 30.6 % (ref 12.0–46.0)
Lymphs Abs: 1.2 10*3/uL (ref 0.7–4.0)
MCHC: 34.3 g/dL (ref 30.0–36.0)
MCV: 96.3 fl (ref 78.0–100.0)
Monocytes Absolute: 0.4 10*3/uL (ref 0.1–1.0)
Monocytes Relative: 10.7 % (ref 3.0–12.0)
Neutro Abs: 2.2 10*3/uL (ref 1.4–7.7)
Neutrophils Relative %: 57.3 % (ref 43.0–77.0)
Platelets: 186 10*3/uL (ref 150.0–400.0)
RBC: 4.55 Mil/uL (ref 4.22–5.81)
RDW: 12.6 % (ref 11.5–15.5)
WBC: 3.9 10*3/uL — ABNORMAL LOW (ref 4.0–10.5)

## 2020-02-01 LAB — BASIC METABOLIC PANEL
BUN: 17 mg/dL (ref 6–23)
CO2: 30 mEq/L (ref 19–32)
Calcium: 9.7 mg/dL (ref 8.4–10.5)
Chloride: 99 mEq/L (ref 96–112)
Creatinine, Ser: 1.01 mg/dL (ref 0.40–1.50)
GFR: 76.79 mL/min (ref >60.00)
Glucose, Bld: 99 mg/dL (ref 70–99)
Potassium: 4.7 mEq/L (ref 3.5–5.1)
Sodium: 133 mEq/L — ABNORMAL LOW (ref 135–145)

## 2020-02-01 LAB — LIPID PANEL
Cholesterol: 139 mg/dL (ref 0–200)
HDL: 48.7 mg/dL (ref >39.00)
LDL Cholesterol: 79 mg/dL (ref 0–99)
NonHDL: 90.24
Total CHOL/HDL Ratio: 3
Triglycerides: 56 mg/dL (ref 0.0–149.0)
VLDL: 11.2 mg/dL (ref 0.0–40.0)

## 2020-02-01 LAB — HEPATIC FUNCTION PANEL
ALT: 14 U/L (ref 0–53)
AST: 18 U/L (ref 0–37)
Albumin: 4.4 g/dL (ref 3.5–5.2)
Alkaline Phosphatase: 46 U/L (ref 39–117)
Bilirubin, Direct: 0.1 mg/dL (ref 0.0–0.3)
Total Bilirubin: 0.5 mg/dL (ref 0.2–1.2)
Total Protein: 6.7 g/dL (ref 6.0–8.3)

## 2020-02-01 LAB — TESTOSTERONE: Testosterone: 476.36 ng/dL (ref 300.00–890.00)

## 2020-02-01 LAB — PSA: PSA: 4.76 ng/mL — ABNORMAL HIGH (ref 0.10–4.00)

## 2020-02-01 MED FILL — CANDESARTAN CILEXETIL 32 MG: 32 | 90 days supply | Qty: 90 | Fill #0

## 2020-02-01 NOTE — Progress Notes (Signed)
Established Patient Office Visit  Subjective:  Patient ID: Shane Perkins, male    DOB: Oct 29, 1950  Age: 70 y.o. MRN: [id number]  CC:  Chief Complaint  Patient presents with  . Abnormal Lab    HPI Shane Perkins presents for medical follow-up. He has history of low testosterone, past history of mildly elevated PSA, hypertension. He is followed by urology and remains on testosterone replacement. He relates he had recent coronary calcium score which placed him 68th percentile for age. He had CT angiogram back in 2008 with coronary calcium of 0 that time with no significant coronary disease. He has considered possible stress testing based on results above versus repeat CTA. He also would like to get follow-up lipids. Has not had lipids in some time. Based on data above would consider more aggressive treatment of LDL  He also needs follow-up labs regarding CBC and PSA with his ongoing testosterone therapy. Generally feels well. No recent chest pains.  Past Medical History:  Diagnosis Date  . Colon polyps   . History of colonic polyps 08/13/2004   Qualifier: Diagnosis of  By: Carlean Purl MD, Dimas Millin Hypertension   . Low testosterone   . Wears contact lenses     Past Surgical History:  Procedure Laterality Date  . COLONOSCOPY    . HAMMERTOE RECONSTRUCTION WITH WEIL OSTEOTOMY Right 03/16/2014   Procedure: RIGHT SECOND/THIRD METATARSAL WEIL OSTEOSTOMY AND HAMMERTOE CORRECTION;  Surgeon: Wylene Simmer, MD;  Location: [facility];  Service: Orthopedics;  Laterality: Right;  ANESTHESIA: GENERAL/REGIONAL BLOCK  . PROSTATE SURGERY  2010   bx  . VARIOCOCELE REPAIR  1985    Family History  Problem Relation Age of Onset  . Heart disease Mother   . Hypertension Mother   . Colon polyps Brother   . Colon cancer Paternal Uncle        possible.  not sure  . Esophageal cancer Neg Hx   . Pancreatic cancer Neg Hx   . Prostate cancer Neg Hx   . Rectal cancer Neg Hx   .  Stomach cancer Neg Hx     Social History   Socioeconomic History  . Marital status: Married    Spouse name: Not on file  . Number of children: Not on file  . Years of education: Not on file  . Highest education level: Not on file  Occupational History  . Not on file  Tobacco Use  . Smoking status: Never Smoker  . Smokeless tobacco: Never Used  Vaping Use  . Vaping Use: Never used  Substance and Sexual Activity  . Alcohol use: Yes    Comment: occ. 2 glasses wine month per pt  . Drug use: No  . Sexual activity: Not on file  Other Topics Concern  . Not on file  Social History Narrative  . Not on file   Social Determinants of Health   Financial Resource Strain: Not on file  Food Insecurity: Not on file  Transportation Needs: Not on file  Physical Activity: Not on file  Stress: Not on file  Social Connections: Not on file  Intimate Partner Violence: Not on file    Outpatient Medications Prior to Visit  Medication Sig Dispense Refill  . candesartan (ATACAND) 32 MG tablet TAKE 1 TABLET (32 MG TOTAL) BY MOUTH DAILY. 90 tablet 1  . sildenafil (VIAGRA) 100 MG tablet TAKE 1/2 TO 1 TABLET BY MOUTH DAILY AS NEEDED FOR ERECTILE DYSFUNCTION 6 tablet 11  .  Testosterone 20.25 MG/ACT (1.62%) GEL APPLY 1 PUMP TO EACH ARM ONCE DAILY AS DIRECTED 225 g 1  . tadalafil (CIALIS) 5 MG tablet Take 1 tablet (5 mg total) by mouth daily. 90 tablet 3   Facility-Administered Medications Prior to Visit  Medication Dose Route Frequency Provider Last Rate Last Admin  . 0.9 %  sodium chloride infusion  500 mL Intravenous Continuous Gatha Mayer, MD        Allergies  Allergen Reactions  . Red Dye Anaphylaxis    ROS Review of Systems  Constitutional: Negative for chills and fever.  Respiratory: Negative for shortness of breath.   Cardiovascular: Negative for chest pain and palpitations.      Objective:    Physical Exam Vitals reviewed.  Constitutional:      Appearance: Normal  appearance.  Cardiovascular:     Rate and Rhythm: Normal rate and regular rhythm.  Pulmonary:     Effort: Pulmonary effort is normal.     Breath sounds: Normal breath sounds. No wheezing or rales.  Neurological:     Mental Status: He is alert.     BP 134/84   Pulse (!) 57   Ht 5' 7.25" (1.708 m)   Wt 185 lb (83.9 kg)   SpO2 99%   BMI 28.76 kg/m  Wt Readings from Last 3 Encounters:  02/01/20 185 lb (83.9 kg)  08/08/19 180 lb 1.6 oz (81.7 kg)  01/04/19 184 lb 1.6 oz (83.5 kg)     Health Maintenance Due  Topic Date Due  . Hepatitis C Screening  Never done  . COVID-19 Vaccine (1) Never done  . PNA vac Low Risk Adult (2 of 2 - PPSV23) 11/28/2018  . INFLUENZA VACCINE  08/14/2019    There are no preventive care reminders to display for this patient.  Lab Results  Component Value Date   TSH 2.12 07/11/2013   Lab Results  Component Value Date   WBC 3.9 (L) 01/04/2019   HGB 14.2 01/04/2019   HCT 41.9 01/04/2019   MCV 97.2 01/04/2019   PLT 195.0 01/04/2019   Lab Results  Component Value Date   NA 134 (L) 01/04/2019   K 4.3 01/04/2019   CO2 26 01/04/2019   GLUCOSE 86 01/04/2019   BUN 20 01/04/2019   CREATININE 1.03 01/04/2019   BILITOT 0.4 04/13/2015   ALKPHOS 42 04/13/2015   AST 16 04/13/2015   ALT 15 04/13/2015   PROT 6.8 04/13/2015   ALBUMIN 4.1 04/13/2015   CALCIUM 9.1 01/04/2019   ANIONGAP 7 03/15/2014   GFR 72.09 01/04/2019   Lab Results  Component Value Date   CHOL 149 03/16/2014   Lab Results  Component Value Date   HDL 40 03/16/2014   Lab Results  Component Value Date   LDLCALC 74 03/16/2014   Lab Results  Component Value Date   TRIG 177 (A) 03/16/2014   Lab Results  Component Value Date   CHOLHDL 4 04/08/2012   No results found for: HGBA1C    Assessment & Plan:   Problem List Items Addressed This Visit      Unprioritized   Low testosterone - Primary   Relevant Orders   Basic metabolic panel   Lipid panel   CBC with  Differential/Platelet   Hepatic function panel   PSA   Testosterone    Other Visit Diagnoses    High coronary artery calcium score       Relevant Orders   Lipid panel   Hepatic  function panel    -Check labs above with CBC, PSA, testosterone, CMP, lipid panel -We will assess lipids first but consider more aggressive treatment of lipids with recent increased coronary calcium score. Consider rosuvastatin 20 mg daily -He is contemplating whether to consider follow-up CTA versus stress testing  No orders of the defined types were placed in this encounter.   Follow-up: No follow-ups on file.    Carolann Littler, MD

## 2020-02-03 ENCOUNTER — Encounter: Payer: Self-pay | Admitting: Family Medicine

## 2020-02-03 ENCOUNTER — Other Ambulatory Visit: Payer: Self-pay | Admitting: Family Medicine

## 2020-02-03 MED ORDER — ROSUVASTATIN CALCIUM 40 MG PO TABS: 40.0000 mg | ORAL_TABLET | Freq: Every day | ORAL | 3 refills | Status: DC

## 2020-02-03 MED FILL — ROSUVASTATIN CALCIUM 40 MG: 40 | 90 days supply | Qty: 90 | Fill #0

## 2020-02-03 NOTE — Addendum Note (Signed)
Addended by: [name] on: 02/03/2020 09:25 AM   Modules accepted: Orders

## 2020-02-07 ENCOUNTER — Telehealth (INDEPENDENT_AMBULATORY_CARE_PROVIDER_SITE_OTHER): Payer: 59 | Admitting: Cardiology

## 2020-02-07 ENCOUNTER — Telehealth: Payer: Self-pay | Admitting: *Deleted

## 2020-02-07 DIAGNOSIS — R0600 Dyspnea, unspecified: Secondary | ICD-10-CM

## 2020-02-07 DIAGNOSIS — I2510 Atherosclerotic heart disease of native coronary artery without angina pectoris: Secondary | ICD-10-CM

## 2020-02-07 DIAGNOSIS — R0609 Other forms of dyspnea: Secondary | ICD-10-CM

## 2020-02-07 DIAGNOSIS — I2584 Coronary atherosclerosis due to calcified coronary lesion: Secondary | ICD-10-CM

## 2020-02-07 NOTE — Telephone Encounter (Signed)
  Patient Consent for Virtual Visit         Shane Perkins has provided verbal consent on 02/07/2020 for a virtual visit (video or telephone).   CONSENT FOR VIRTUAL VISIT FOR:  Shane Perkins  By participating in this virtual visit I agree to the following:  I hereby voluntarily request, consent and authorize [facility] and its employed or contracted physicians, physician assistants, nurse practitioners or other licensed health care professionals (the Practitioner), to provide me with telemedicine health care services (the "Services") as deemed necessary by the treating Practitioner. I acknowledge and consent to receive the Services by the Practitioner via telemedicine. I understand that the telemedicine visit will involve communicating with the Practitioner through live audiovisual communication technology and the disclosure of certain medical information by electronic transmission. I acknowledge that I have been given the opportunity to request an in-person assessment or other available alternative prior to the telemedicine visit and am voluntarily participating in the telemedicine visit.  I understand that I have the right to withhold or withdraw my consent to the use of telemedicine in the course of my care at any time, without affecting my right to future care or treatment, and that the Practitioner or I may terminate the telemedicine visit at any time. I understand that I have the right to inspect all information obtained and/or recorded in the course of the telemedicine visit and may receive copies of available information for a reasonable fee.  I understand that some of the potential risks of receiving the Services via telemedicine include:  Marland Kitchen Delay or interruption in medical evaluation due to technological equipment failure or disruption; . Information transmitted may not be sufficient (e.g. poor resolution of images) to allow for appropriate medical decision making by the  Practitioner; and/or  . In rare instances, security protocols could fail, causing a breach of personal health information.  Furthermore, I acknowledge that it is my responsibility to provide information about my medical history, conditions and care that is complete and accurate to the best of my ability. I acknowledge that Practitioner's advice, recommendations, and/or decision may be based on factors not within their control, such as incomplete or inaccurate data provided by me or distortions of diagnostic images or specimens that may result from electronic transmissions. I understand that the practice of medicine is not an exact science and that Practitioner makes no warranties or guarantees regarding treatment outcomes. I acknowledge that a copy of this consent can be made available to me via my patient portal ([facility]), or I can request a printed copy by calling the office of [facility].    I understand that my insurance will be billed for this visit.   I have read or had this consent read to me. . I understand the contents of this consent, which adequately explains the benefits and risks of the Services being provided via telemedicine.  . I have been provided ample opportunity to ask questions regarding this consent and the Services and have had my questions answered to my satisfaction. . I give my informed consent for the services to be provided through the use of telemedicine in my medical care

## 2020-02-07 NOTE — Progress Notes (Signed)
Virtual Visit via Telephone Note   This visit type was conducted due to national recommendations for restrictions regarding the COVID-19 Pandemic (e.g. social distancing) in an effort to limit this patient's exposure and mitigate transmission in our community.  Due to his co-morbid illnesses, this patient is at least at moderate risk for complications without adequate follow up.  This format is felt to be most appropriate for this patient at this time.  The patient did not have access to video technology/had technical difficulties with video requiring transitioning to audio format only (telephone).  All issues noted in this document were discussed and addressed.  No physical exam could be performed with this format.  Please refer to the patient's chart for his  consent to telehealth for [facility].   Date:  02/07/2020   ID:  Shane Perkins, DOB 1950-08-19, MRN [id number] The patient was identified using 2 identifiers.  Patient Location: Home Provider Location: Office/Clinic  PCP:  [name], MD  Cardiologist: New patient  Evaluation Performed:  New Patient Evaluation  Chief Complaint:  Abnormal calcium scoring  History of Present Illness:    Shane Perkins is a 70 y.o. male with history of hypertension, and family history of premature coronary artery disease in his mother, recently underwent calcium scoring that showed calcium score of 322 that represents 71st percentile for age and sex.  The patient does not have symptoms concerning for COVID-19 infection (fever, chills, cough, or new shortness of breath).   Past Medical History:  Diagnosis Date  . Colon polyps   . History of colonic polyps 08/13/2004   Qualifier: Diagnosis of  By: Carlean Purl MD, Dimas Millin Hypertension   . Low testosterone   . Wears contact lenses    Past Surgical History:  Procedure Laterality Date  . COLONOSCOPY    . HAMMERTOE RECONSTRUCTION WITH WEIL OSTEOTOMY Right 03/16/2014   Procedure:  RIGHT SECOND/THIRD METATARSAL WEIL OSTEOSTOMY AND HAMMERTOE CORRECTION;  Surgeon: Wylene Simmer, MD;  Location: [facility];  Service: Orthopedics;  Laterality: Right;  ANESTHESIA: GENERAL/REGIONAL BLOCK  . PROSTATE SURGERY  2010   bx  . [place]    No outpatient medications have been marked as taking for the 02/07/20 encounter (Appointment) with Dorothy Spark, MD.   Current Facility-Administered Medications for the 02/07/20 encounter (Appointment) with Dorothy Spark, MD  Medication  . 0.9 %  sodium chloride infusion    Allergies:   Red dye   Social History   Tobacco Use  . Smoking status: Never Smoker  . Smokeless tobacco: Never Used  Vaping Use  . Vaping Use: Never used  Substance Use Topics  . Alcohol use: Yes    Comment: occ. 2 glasses wine month per pt  . Drug use: No    Family Hx: The patient's family history includes Colon cancer in his paternal uncle; Colon polyps in his brother; Heart disease in his mother; Hypertension in his mother. There is no history of Esophageal cancer, Pancreatic cancer, Prostate cancer, Rectal cancer, or Stomach cancer.  ROS:   Please see the history of present illness.    All other systems reviewed and are negative.  Prior CV studies:   The following studies were reviewed today:  Calcium score   Labs/Other Tests and Data Reviewed:    EKG:  No ECG reviewed.  Recent Labs: 02/01/2020: ALT 14; BUN 17; Creatinine, Ser 1.01; Hemoglobin 15.0; Platelets 186.0; Potassium 4.7; Sodium 133  Recent Lipid Panel Lab Results  Component Value Date/Time   CHOL 139 02/01/2020 10:49 AM   TRIG 56.0 02/01/2020 10:49 AM   HDL 48.70 02/01/2020 10:49 AM   CHOLHDL 3 02/01/2020 10:49 AM   LDLCALC 79 02/01/2020 10:49 AM    Wt Readings from Last 3 Encounters:  02/01/20 185 lb (83.9 kg)  08/08/19 180 lb 1.6 oz (81.7 kg)  01/04/19 184 lb 1.6 oz (83.5 kg)     Objective:    Vital Signs:  There were no vitals taken for  this visit.    ASSESSMENT & PLAN:    1. Abnormal calcium score of 322 that represents 71st percentile for age and sex and strong family history of premature CAD, we will proceed with coronary CTA. 2. We will start rosuvastatin 40 mg daily as well as aspirin 81 mg daily.  Repeat lipids and LFTs in 4 to 6 weeks.  COVID-19 Education: The signs and symptoms of COVID-19 were discussed with the patient and how to seek care for testing (follow up with PCP or arrange E-visit).  The importance of social distancing was discussed today.  Time:   Today, I have spent 15 minutes with the patient with telehealth technology discussing the above problems.     Medication Adjustments/Labs and Tests Ordered: Current medicines are reviewed at length with the patient today.  Concerns regarding medicines are outlined above.   Tests Ordered: No orders of the defined types were placed in this encounter.   Medication Changes: No orders of the defined types were placed in this encounter.   Follow Up:  Virtual Visit  prn  Signed, Ena Dawley, MD  02/07/2020 10:41 AM    [place]

## 2020-02-08 ENCOUNTER — Ambulatory Visit (HOSPITAL_COMMUNITY)
Admission: RE | Admit: 2020-02-08 | Discharge: 2020-02-08 | Disposition: A | Discharge status: Home / Self Care | Payer: 59 | Source: Ambulatory Visit | Attending: Cardiology | Admitting: Cardiology

## 2020-02-08 ENCOUNTER — Other Ambulatory Visit (HOSPITAL_COMMUNITY): Payer: Self-pay | Admitting: Cardiology

## 2020-02-08 DIAGNOSIS — R079 Chest pain, unspecified: Secondary | ICD-10-CM | POA: Diagnosis not present

## 2020-02-08 DIAGNOSIS — I2510 Atherosclerotic heart disease of native coronary artery without angina pectoris: Secondary | ICD-10-CM | POA: Diagnosis not present

## 2020-02-08 MED ORDER — IOHEXOL 350 MG/ML SOLN
80.0000 mL | Freq: Once | INTRAVENOUS | Status: AC | PRN
  Administered 2020-02-08: 80 mL via INTRAVENOUS

## 2020-02-08 MED ORDER — NITROGLYCERIN 0.4 MG SL SUBL
0.8000 mg | SUBLINGUAL_TABLET | Freq: Once | SUBLINGUAL | Status: AC
  Administered 2020-02-08: 0.8 mg via SUBLINGUAL

## 2020-03-20 ENCOUNTER — Encounter: Payer: Self-pay | Admitting: Family Medicine

## 2020-03-21 ENCOUNTER — Other Ambulatory Visit: Payer: Self-pay | Admitting: Family Medicine

## 2020-03-21 DIAGNOSIS — E785 Hyperlipidemia, unspecified: Secondary | ICD-10-CM

## 2020-03-21 NOTE — Progress Notes (Signed)
Patient recently started on Crestor.  Tolerating well with no side effects.  We will schedule lipid, hepatic, CPK, lipoprotein a

## 2020-03-23 ENCOUNTER — Other Ambulatory Visit: Payer: Self-pay

## 2020-03-23 ENCOUNTER — Other Ambulatory Visit (INDEPENDENT_AMBULATORY_CARE_PROVIDER_SITE_OTHER): Payer: 59

## 2020-03-23 DIAGNOSIS — E785 Hyperlipidemia, unspecified: Secondary | ICD-10-CM

## 2020-03-23 LAB — LIPID PANEL
Cholesterol: 97 mg/dL (ref 0–200)
HDL: 49.7 mg/dL (ref >39.00)
LDL Cholesterol: 34 mg/dL (ref 0–99)
NonHDL: 47.51
Total CHOL/HDL Ratio: 2
Triglycerides: 68 mg/dL (ref 0.0–149.0)
VLDL: 13.6 mg/dL (ref 0.0–40.0)

## 2020-03-23 LAB — HEPATIC FUNCTION PANEL
ALT: 17 U/L (ref 0–53)
AST: 19 U/L (ref 0–37)
Albumin: 4.2 g/dL (ref 3.5–5.2)
Alkaline Phosphatase: 47 U/L (ref 39–117)
Bilirubin, Direct: 0.1 mg/dL (ref 0.0–0.3)
Total Bilirubin: 0.5 mg/dL (ref 0.2–1.2)
Total Protein: 6.8 g/dL (ref 6.0–8.3)

## 2020-03-23 LAB — CK: Total CK: 120 U/L (ref 7–232)

## 2020-03-26 ENCOUNTER — Other Ambulatory Visit (HOSPITAL_COMMUNITY): Payer: Self-pay | Admitting: Family Medicine

## 2020-03-26 ENCOUNTER — Other Ambulatory Visit: Payer: Self-pay | Admitting: Family Medicine

## 2020-03-26 NOTE — Telephone Encounter (Signed)
Last filled 10/07/19

## 2020-03-27 LAB — LIPOPROTEIN A (LPA): Lipoprotein (a): 120 nmol/L — ABNORMAL HIGH (ref <75)

## 2020-04-28 MED FILL — Candesartan Cilexetil Tab 32 MG: ORAL | 90 days supply | Qty: 90 | Fill #0 | Status: AC

## 2020-04-28 MED FILL — Rosuvastatin Calcium Tab 40 MG: ORAL | 90 days supply | Qty: 90 | Fill #0 | Status: AC

## 2020-04-30 ENCOUNTER — Other Ambulatory Visit (HOSPITAL_COMMUNITY): Payer: Self-pay

## 2020-05-01 ENCOUNTER — Other Ambulatory Visit (HOSPITAL_COMMUNITY): Payer: Self-pay

## 2020-05-24 ENCOUNTER — Ambulatory Visit: Payer: 59 | Admitting: Internal Medicine

## 2020-07-10 ENCOUNTER — Ambulatory Visit (INDEPENDENT_AMBULATORY_CARE_PROVIDER_SITE_OTHER): Payer: 59 | Admitting: Internal Medicine

## 2020-07-10 ENCOUNTER — Other Ambulatory Visit: Payer: Self-pay

## 2020-07-10 VITALS — BP 116/86 | HR 64 | Ht 67.25 in | Wt 179.0 lb

## 2020-07-10 DIAGNOSIS — I2584 Coronary atherosclerosis due to calcified coronary lesion: Secondary | ICD-10-CM

## 2020-07-10 DIAGNOSIS — E7841 Elevated Lipoprotein(a): Secondary | ICD-10-CM

## 2020-07-10 DIAGNOSIS — Z7189 Other specified counseling: Secondary | ICD-10-CM | POA: Diagnosis not present

## 2020-07-10 DIAGNOSIS — E782 Mixed hyperlipidemia: Secondary | ICD-10-CM | POA: Diagnosis not present

## 2020-07-10 DIAGNOSIS — I2510 Atherosclerotic heart disease of native coronary artery without angina pectoris: Secondary | ICD-10-CM | POA: Diagnosis not present

## 2020-07-10 NOTE — Progress Notes (Signed)
LIPID CLINIC CONSULT NOTE  Chief Complaint:  Lipid consultation  Primary Care Physician: [name], MD  Primary Cardiologist:  None  HPI:  Shane Perkins is a 70 y.o. male who is being seen today for the evaluation of dyslipidemia at the request of Burchette, Alinda Sierras, MD. I had the privilege of seeing Shane Perkins today in the office for dyslipidemia.  He was referred for recent abnormal calcium score.  He explained to me that he had a negative calcium score in the past but recently had an abnormal screening calcium score.  He was previously not on any lipid-lowering therapies.  A CT coronary angiogram was performed showing a calcium score 405, 75th percentile for age and sex matched control.  It was noted that there was mild nonobstructive coronary disease and moderate disease in the ostial portion of a small D1 artery (personally reviewed).  He is very active, exercises regularly, and denies any chest pain or worsening shortness of breath.  Based on this his lipids were drawn with total cholesterol 139, triglycerides 56, HDL 48.7 and LDL 79.  He was subsequently started on rosuvastatin 40 mg daily.  He says he is tolerating this well without any statin side effects.  Repeat lipid profile 3 months ago showed total cholesterol 97, triglycerides 68, HDL 49.7 and LDL 34.  In addition, an LP(a) was performed and resulted abnormal at 120 nmol/L.  He does report history of heart disease in his family including his brother and several other family members with high cholesterol.  Overall his diet is quite healthy and weight is appropriate.  PMHx:  Past Medical History:  Diagnosis Date   Colon polyps    History of colonic polyps 08/13/2004   Qualifier: Diagnosis of  By: Carlean Purl MD, Tonna Boehringer E    Hypertension    Low testosterone    Wears contact lenses     Past Surgical History:  Procedure Laterality Date   COLONOSCOPY     HAMMERTOE RECONSTRUCTION WITH WEIL OSTEOTOMY Right 03/16/2014    Procedure: RIGHT SECOND/THIRD METATARSAL WEIL OSTEOSTOMY AND HAMMERTOE CORRECTION;  Surgeon: Wylene Simmer, MD;  Location: [facility];  Service: Orthopedics;  Laterality: Right;  ANESTHESIA: GENERAL/REGIONAL BLOCK   PROSTATE SURGERY  2010   bx   VARIOCOCELE REPAIR  1985    FAMHx:  Family History  Problem Relation Age of Onset   Heart disease Mother    Hypertension Mother    Colon polyps Brother    Colon cancer Paternal Uncle        possible.  not sure   Esophageal cancer Neg Hx    Pancreatic cancer Neg Hx    Prostate cancer Neg Hx    Rectal cancer Neg Hx    Stomach cancer Neg Hx     SOCHx:   reports that he has never smoked. He has never used smokeless tobacco. He reports current alcohol use. He reports that he does not use drugs.  ALLERGIES:  Allergies  Allergen Reactions   Red Dye Anaphylaxis    ROS: Pertinent items noted in HPI and remainder of comprehensive ROS otherwise negative.  HOME MEDS: Current Outpatient Medications on File Prior to Visit  Medication Sig Dispense Refill   candesartan (ATACAND) 32 MG tablet TAKE 1 TABLET (32 MG TOTAL) BY MOUTH DAILY. 90 tablet 1   rosuvastatin (CRESTOR) 40 MG tablet TAKE 1 TABLET (40 MG TOTAL) BY MOUTH DAILY. 90 tablet 3   sildenafil (VIAGRA) 100 MG tablet  TAKE 1/2 TO 1 TABLET BY MOUTH DAILY AS NEEDED FOR ERECTILE DYSFUNCTION 6 tablet 11   Testosterone 1.62 % GEL APPLY 1 PUMP TO EACH ARM ONCE DAILY AS DIRECTED 225 g 1   Current Facility-Administered Medications on File Prior to Visit  Medication Dose Route Frequency Provider Last Rate Last Admin   0.9 %  sodium chloride infusion  500 mL Intravenous Continuous Gatha Mayer, MD        LABS/IMAGING: No results found for this or any previous visit (from the past 48 hour(s)). No results found.  LIPID PANEL:    Component Value Date/Time   CHOL 97 03/23/2020 0730   TRIG 68.0 03/23/2020 0730   HDL 49.70 03/23/2020 0730   CHOLHDL 2 03/23/2020 0730   VLDL  13.6 03/23/2020 0730   LDLCALC 34 03/23/2020 0730    WEIGHTS: Wt Readings from Last 3 Encounters:  07/10/20 179 lb (81.2 kg)  02/01/20 185 lb (83.9 kg)  08/08/19 180 lb 1.6 oz (81.7 kg)    VITALS: BP 116/86   Pulse 64   Ht 5' 7.25" (1.708 m)   Wt 179 lb (81.2 kg)   SpO2 94%   BMI 27.83 kg/m   EXAM: Deferred  EKG: Deferred  ASSESSMENT: Mixed dyslipidemia, goal LDL <70 Elevated LP(a) of 120 Abnormal CAC of 405, 75th percentile with mild to moderate non-obstructive CAD (01/2020) Family history of CAD in his brother  PLAN: 55.   Shane Perkins has had significant reduction in LDL cholesterol and high potency rosuvastatin 40 mg daily.  In fact his LDL cholesterol is quite low even after only 2 months on treatment.  I suspect it will be even lower on repeat check in the future.  Unfortunately, LP(a) is elevated suggesting probable increased cardiovascular risk.  This is not likely to be lowered with statin therapy alone.  He would not qualify at this point for a PCSK9 inhibitor, however, he may wish to consider a clinical trial specifically with pelcarsen [employer]), which requires an LP(a) >75 ng/dL - he would not qualify for the [employer] LP(a) trial. I'm not sure, however, if his LP(a) level will be high enough. Will reach out to Regenia Skeeter, PharmD, who runs trial to see if they are still enrolling and if he would qualify.  Otherwise, would recommend continuing his current lipid therapy for now.  Pixie Casino, MD, [facility], [place] Director of the Advanced Lipid Disorders &  Cardiovascular Risk Reduction Clinic Diplomate of the American Board of Clinical Lipidology Attending Cardiologist  Direct Dial: [phone]  Fax: [phone]  Website:  [email or web address] 07/10/2020, 9:13 PM

## 2020-07-10 NOTE — Patient Instructions (Signed)
Medication Instructions:  No Changes  *If you need a refill on your cardiac medications before your next appointment, please call your pharmacy*   Follow-Up: At [facility], you and your health needs are our priority.  As part of our continuing mission to provide you with exceptional heart care, we have created designated Provider Care Teams.  These Care Teams include your primary Cardiologist (physician) and Advanced Practice Providers (APPs -  Physician Assistants and Nurse Practitioners) who all work together to provide you with the care you need, when you need it.  We recommend signing up for the patient portal called "MyChart".  Sign up information is provided on this After Visit Summary.  MyChart is used to connect with patients for Virtual Visits (Telemedicine).  Patients are able to view lab/test results, encounter notes, upcoming appointments, etc.  Non-urgent messages can be sent to your provider as well.   To learn more about what you can do with MyChart, go to [email or web address].    Your next appointment:   As needed with Dr. Debara Pickett   Other Instructions Dr. Debara Pickett has referred you for the LP(a) trial

## 2020-08-03 ENCOUNTER — Other Ambulatory Visit (HOSPITAL_COMMUNITY): Payer: Self-pay

## 2020-08-03 ENCOUNTER — Other Ambulatory Visit: Payer: Self-pay | Admitting: Family Medicine

## 2020-08-03 MED ORDER — CANDESARTAN CILEXETIL 32 MG PO TABS
32.0000 mg | ORAL_TABLET | Freq: Every day | ORAL | 1 refills | Status: DC
  Filled 2020-08-03: qty 90, 90d supply, fill #0
  Filled 2020-11-05: qty 90, 90d supply, fill #1

## 2020-08-03 MED FILL — Rosuvastatin Calcium Tab 40 MG: ORAL | 90 days supply | Qty: 90 | Fill #1 | Status: AC

## 2020-08-22 ENCOUNTER — Other Ambulatory Visit (HOSPITAL_COMMUNITY): Payer: Self-pay

## 2020-08-22 MED ORDER — AMOXICILLIN 500 MG PO CAPS
500.0000 mg | ORAL_CAPSULE | Freq: Three times a day (TID) | ORAL | 0 refills | Status: AC
  Filled 2020-08-22: qty 21, 7d supply, fill #0

## 2020-08-23 ENCOUNTER — Other Ambulatory Visit: Payer: Self-pay

## 2020-08-23 ENCOUNTER — Ambulatory Visit: Payer: 59 | Admitting: Sports Medicine

## 2020-08-23 DIAGNOSIS — M1711 Unilateral primary osteoarthritis, right knee: Secondary | ICD-10-CM | POA: Insufficient documentation

## 2020-08-23 NOTE — Progress Notes (Signed)
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 He was diagnosed with a torn meniscus in 2015 An MRI in 2015 showed 2 lateral splints in his meniscus as well as a medial split in his meniscus There was some tricompartmental change of mild arthritis but none severe  He chose not to do surgery and with conservative care gradually got better During the pandemic he got back into running but gradually developed some increased knee pain Now he is concerned because of the surgical upstairs in particular and somewhat when he goes downstairs This does not stop him from walking as he walks 5 to 10 miles most days  Review of systems Occasional swelling and tightness Occasional locking  Physical exam Pleasant male in no acute distress BP 120/78   Ht '5\' 10"'$  (1.778 m)   Wt 175 lb (79.4 kg)   BMI 25.11 kg/m  No flowsheet data found.  Right knee appears to have some irregular bony contour Medial joint line is more prominent Full extension Full flexion even -150 degrees Ligaments are stable McMurray's testing shows grinding and clicking but no real pain No palpable effusion  Ultrasound of the right knee  The suprapatellar pouch looks normal without any significant effusion The medial meniscus is bulging outside of the joint line with a small parameniscal cyst The lateral joint line shows a little bit of mild spurring The meniscus appears intact anteriorly but not well visualized from the midline to the posterior line There is some hypoechoic swelling along the lateral joint line Patellar tendon is normal but there is some deep bursal swelling in the fat pad Posterior he has a Baker's cyst and a multiseptated cystic structure along the semimembranosus gastrocnemius insertion that could be bursitis and on MRI was diagnosed as a multiloculated ganglion  Impression: Some degenerative meniscal changes with hypoechoic swelling at the medial and lateral  joint lines and findings consistent with mild arthritis  Ultrasound and interpretation by Wolfgang Phoenix. Yaeko Fazekas, MD And Dr. Ky Barban

## 2020-08-23 NOTE — Assessment & Plan Note (Signed)
I reassured him that his function and motion were quite good Try compression sleeve PRN NSAIDs Icing We can use CSI if this worsens  Reck PRN

## 2020-09-19 ENCOUNTER — Other Ambulatory Visit (HOSPITAL_COMMUNITY): Payer: Self-pay

## 2020-09-19 MED FILL — Testosterone TD Gel 20.25 MG/ACT (1.62%): TRANSDERMAL | 60 days supply | Qty: 75 | Fill #0 | Status: CN

## 2020-09-20 ENCOUNTER — Other Ambulatory Visit (HOSPITAL_COMMUNITY): Payer: Self-pay

## 2020-09-20 MED FILL — Testosterone TD Gel 20.25 MG/ACT (1.62%): TRANSDERMAL | 90 days supply | Qty: 225 | Fill #0 | Status: AC

## 2020-11-03 ENCOUNTER — Other Ambulatory Visit (HOSPITAL_COMMUNITY): Payer: Self-pay

## 2020-11-03 MED FILL — Rosuvastatin Calcium Tab 40 MG: ORAL | 90 days supply | Qty: 90 | Fill #2 | Status: AC

## 2020-11-05 ENCOUNTER — Other Ambulatory Visit (HOSPITAL_COMMUNITY): Payer: Self-pay

## 2020-11-13 ENCOUNTER — Encounter: Payer: Self-pay | Admitting: Family Medicine

## 2020-11-13 ENCOUNTER — Telehealth: Payer: Self-pay | Admitting: Family Medicine

## 2020-11-13 NOTE — Telephone Encounter (Signed)
Spoke with Dr Lia Foyer.  Was recently had a meeting in [place].  He had some very mild sore throat late last week with negative COVID test last Friday.  He then about 36 hours ago became more symptomatic with some chills and cough and COVID testing positive at that point.  He feels his symptoms are relatively mild at this point.  He would like to observe at this point and treat symptomatically.  We do have a virtual visit set up for tomorrow.  We will touch base tomorrow and certainly could consider Paxlovid for 5 days for any progression in symptoms

## 2020-11-13 NOTE — Telephone Encounter (Signed)
Spoke with the patient. He agreed to have a virtual visit and this has been scheduled.

## 2020-11-13 NOTE — Telephone Encounter (Signed)
Pt call and stated he is symptomatic with covid and want dr.Burchette to call him the medication for covid and want a call back.

## 2020-11-14 ENCOUNTER — Telehealth: Payer: 59 | Admitting: Family Medicine

## 2020-11-14 DIAGNOSIS — U071 COVID-19: Secondary | ICD-10-CM

## 2020-11-14 NOTE — Progress Notes (Signed)
Spoke briefly with patient by phone.  Onset symptoms ST Sunday night Covid negative Monday.   Symptoms progressed and Tuesday home Covid positive.  Mild symptoms   actually walked 4 miles yesterday and 2 miles today.   No dyspnea.   Some congestion and fatigue.  Cough relatively mild.  After discussion, we've decided not to start anti-virals yet unless symptoms worsen.   He has been fully vaccinated.

## 2020-11-28 ENCOUNTER — Ambulatory Visit (INDEPENDENT_AMBULATORY_CARE_PROVIDER_SITE_OTHER): Payer: 59 | Admitting: Family Medicine

## 2020-11-28 VITALS — BP 118/70 | HR 76 | Temp 98.4°F | Wt 179.4 lb

## 2020-11-28 DIAGNOSIS — Z23 Encounter for immunization: Secondary | ICD-10-CM

## 2020-11-28 DIAGNOSIS — Z0000 Encounter for general adult medical examination without abnormal findings: Secondary | ICD-10-CM | POA: Diagnosis not present

## 2020-11-28 MED ORDER — EPINEPHRINE 0.3 MG/0.3ML IJ SOAJ
0.3000 mg | INTRAMUSCULAR | 1 refills | Status: DC | PRN
  Filled 2020-11-28: qty 2, 14d supply, fill #0
  Filled 2021-11-06: qty 2, 14d supply, fill #1

## 2020-11-28 NOTE — Progress Notes (Signed)
Established Patient Office Visit  Subjective:  Patient ID: Shane Perkins, male    DOB: [birth year]  Age: 70 y.o. MRN: [id number]  CC:  Chief Complaint  Patient presents with   Annual Exam    HPI Shane Perkins presents for annual checkup.  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  History of hypertension, osteoarthritis, low testosterone, chronic elevated PSA.  He has seen urologist in the past but cannot get any appointment soon to see them.  Does have history of colon polyps and is due for repeat colonoscopy this December.  Remains on topical testosterone replacement.   He had recent COVID infection couple weeks ago.  He feels like he is almost fully recovered at this time.  Had flu vaccine a few weeks ago.  Had 1 Shingrix.  Had Prevnar 13 couple years ago.  Needs Pneumovax.  He relates previous episode of anaphylactic type symptoms with fruit flavored Tums.  He thinks this was a reaction to red dye 40.  Tries to avoid red dye.  Requesting EpiPen.  History of hyperlipidemia.  Hx of elevated coronary calcium score and also elevated LP(a).  Still taking Crestor.  Has considered possible PCSK9 inhibitors  Past Medical History:  Diagnosis Date   Colon polyps    History of colonic polyps 08/13/2004   Qualifier: Diagnosis of  By: Carlean Purl MD, Tonna Boehringer E    Hypertension    Low testosterone    Wears contact lenses     Past Surgical History:  Procedure Laterality Date   COLONOSCOPY     HAMMERTOE RECONSTRUCTION WITH WEIL OSTEOTOMY Right 03/16/2014   Procedure: RIGHT SECOND/THIRD METATARSAL WEIL OSTEOSTOMY AND HAMMERTOE CORRECTION;  Surgeon: Wylene Simmer, MD;  Location: [facility];  Service: Orthopedics;  Laterality: Right;  ANESTHESIA: GENERAL/REGIONAL BLOCK   PROSTATE SURGERY  2010   bx   VARIOCOCELE REPAIR  1985    Family History  Problem Relation Age of Onset   Heart disease Mother    Hypertension Mother    Colon polyps  Brother    Colon cancer Paternal Uncle        possible.  not sure   Esophageal cancer Neg Hx    Pancreatic cancer Neg Hx    Prostate cancer Neg Hx    Rectal cancer Neg Hx    Stomach cancer Neg Hx     Social History   Socioeconomic History   Marital status: Married    Spouse name: Not on file   Number of children: Not on file   Years of education: Not on file   Highest education level: Not on file  Occupational History   Not on file  Tobacco Use   Smoking status: Never   Smokeless tobacco: Never  Vaping Use   Vaping Use: Never used  Substance and Sexual Activity   Alcohol use: Yes    Comment: occ. 2 glasses wine month per pt   Drug use: No   Sexual activity: Not on file  Other Topics Concern   Not on file  Social History Narrative   Not on file   Social Determinants of Health   Financial Resource Strain: Not on file  Food Insecurity: Not on file  Transportation Needs: Not on file  Physical Activity: Not on file  Stress: Not on file  Social Connections: Not on file  Intimate Partner Violence: Not on file    Outpatient Medications Prior to Visit  Medication Sig  Dispense Refill   candesartan (ATACAND) 32 MG tablet Take 1 tablet (32 mg total) by mouth daily. 90 tablet 1   rosuvastatin (CRESTOR) 40 MG tablet TAKE 1 TABLET (40 MG TOTAL) BY MOUTH DAILY. 90 tablet 3   sildenafil (VIAGRA) 100 MG tablet TAKE 1/2 TO 1 TABLET BY MOUTH DAILY AS NEEDED FOR ERECTILE DYSFUNCTION 6 tablet 11   Testosterone 1.62 % GEL APPLY 1 PUMP TO EACH ARM ONCE DAILY AS DIRECTED 225 g 1   Facility-Administered Medications Prior to Visit  Medication Dose Route Frequency Provider Last Rate Last Admin   0.9 %  sodium chloride infusion  500 mL Intravenous Continuous Gatha Mayer, MD        Allergies  Allergen Reactions   Red Dye Anaphylaxis    ROS Review of Systems  Constitutional:  Negative for unexpected weight change.  Respiratory:  Negative for cough and shortness of breath.    Cardiovascular:  Negative for chest pain.  Genitourinary:  Negative for difficulty urinating and dysuria.     Objective:    Physical Exam Constitutional:      Appearance: He is well-developed.  HENT:     Right Ear: External ear normal.     Left Ear: External ear normal.  Eyes:     Pupils: Pupils are equal, round, and reactive to light.  Neck:     Thyroid: No thyromegaly.  Cardiovascular:     Rate and Rhythm: Normal rate and regular rhythm.  Pulmonary:     Effort: Pulmonary effort is normal. No respiratory distress.     Breath sounds: Normal breath sounds. No wheezing or rales.  Musculoskeletal:     Cervical back: Neck supple.     Right lower leg: No edema.     Left lower leg: No edema.  Neurological:     Mental Status: He is alert and oriented to person, place, and time.    BP 118/70 (BP Location: Left Arm, Patient Position: Sitting, Cuff Size: Normal)   Pulse 76   Temp 98.4 F (36.9 C) (Oral)   Wt 179 lb 6.4 oz (81.4 kg)   SpO2 97%   BMI 25.74 kg/m  Wt Readings from Last 3 Encounters:  11/28/20 179 lb 6.4 oz (81.4 kg)  08/23/20 175 lb (79.4 kg)  07/10/20 179 lb (81.2 kg)     Health Maintenance Due  Topic Date Due   COVID-19 Vaccine (1) Never done   Hepatitis C Screening  Never done   Zoster Vaccines- Shingrix (2 of 2) 01/22/2018   INFLUENZA VACCINE  08/13/2020    There are no preventive care reminders to display for this patient.  Lab Results  Component Value Date   TSH 2.12 07/11/2013   Lab Results  Component Value Date   WBC 3.9 (L) 02/01/2020   HGB 15.0 02/01/2020   HCT 43.9 02/01/2020   MCV 96.3 02/01/2020   PLT 186.0 02/01/2020   Lab Results  Component Value Date   NA 133 (L) 02/01/2020   K 4.7 02/01/2020   CO2 30 02/01/2020   GLUCOSE 99 02/01/2020   BUN 17 02/01/2020   CREATININE 1.01 02/01/2020   BILITOT 0.5 03/23/2020   ALKPHOS 47 03/23/2020   AST 19 03/23/2020   ALT 17 03/23/2020   PROT 6.8 03/23/2020   ALBUMIN 4.2 03/23/2020    CALCIUM 9.7 02/01/2020   ANIONGAP 7 03/15/2014   GFR 76.79 02/01/2020   Lab Results  Component Value Date   CHOL 97 03/23/2020   Lab  Results  Component Value Date   HDL 49.70 03/23/2020   Lab Results  Component Value Date   LDLCALC 34 03/23/2020   Lab Results  Component Value Date   TRIG 68.0 03/23/2020   Lab Results  Component Value Date   CHOLHDL 2 03/23/2020   No results found for: HGBA1C    Assessment & Plan:   Problem List Items Addressed This Visit   None Visit Diagnoses     Physical exam    -  Primary   Relevant Orders   Basic metabolic panel   Lipid panel   CBC with Differential/Platelet   Hepatic function panel   PSA   Testosterone   Ambulatory referral to Gastroenterology   Need for pneumococcal vaccination       Relevant Orders   Pneumococcal polysaccharide vaccine 23-valent greater than or equal to 2yo subcutaneous/IM (Completed)     -Pneumovax given -Flu vaccine already given -Has had prior Zostavax and had 1 Shingrix -Set up repeat colonoscopy.  He is due for 5-year follow-up. -Prescription for EpiPen provided given prior history of red dye allergy with anaphylaxis -Future lab orders placed. -Continue regular exercise habits  Meds ordered this encounter  Medications   EPINEPHrine (EPIPEN 2-PAK) 0.3 mg/0.3 mL IJ SOAJ injection    Sig: Inject 0.3 mg into the muscle as needed for anaphylaxis.    Dispense:  1 each    Refill:  1    Follow-up: No follow-ups on file.    Carolann Littler, MD

## 2020-11-29 ENCOUNTER — Other Ambulatory Visit (HOSPITAL_COMMUNITY): Payer: Self-pay

## 2020-12-03 ENCOUNTER — Encounter: Payer: Self-pay | Admitting: Family Medicine

## 2020-12-03 ENCOUNTER — Other Ambulatory Visit (INDEPENDENT_AMBULATORY_CARE_PROVIDER_SITE_OTHER): Payer: 59

## 2020-12-03 ENCOUNTER — Other Ambulatory Visit (HOSPITAL_COMMUNITY): Payer: Self-pay

## 2020-12-03 DIAGNOSIS — Z0000 Encounter for general adult medical examination without abnormal findings: Secondary | ICD-10-CM | POA: Diagnosis not present

## 2020-12-03 LAB — CBC WITH DIFFERENTIAL/PLATELET
Basophils Absolute: 0 10*3/uL (ref 0.0–0.1)
Basophils Relative: 0.7 % (ref 0.0–3.0)
Eosinophils Absolute: 0.1 10*3/uL (ref 0.0–0.7)
Eosinophils Relative: 2.1 % (ref 0.0–5.0)
HCT: 42 % (ref 39.0–52.0)
Hemoglobin: 14.3 g/dL (ref 13.0–17.0)
Lymphocytes Relative: 30.8 % (ref 12.0–46.0)
Lymphs Abs: 1.5 10*3/uL (ref 0.7–4.0)
MCHC: 34.1 g/dL (ref 30.0–36.0)
MCV: 95.8 fl (ref 78.0–100.0)
Monocytes Absolute: 0.6 10*3/uL (ref 0.1–1.0)
Monocytes Relative: 12.7 % — ABNORMAL HIGH (ref 3.0–12.0)
Neutro Abs: 2.6 10*3/uL (ref 1.4–7.7)
Neutrophils Relative %: 53.7 % (ref 43.0–77.0)
Platelets: 230 10*3/uL (ref 150.0–400.0)
RBC: 4.38 Mil/uL (ref 4.22–5.81)
RDW: 12.8 % (ref 11.5–15.5)
WBC: 4.9 10*3/uL (ref 4.0–10.5)

## 2020-12-03 LAB — LIPID PANEL
Cholesterol: 99 mg/dL (ref 0–200)
HDL: 48.3 mg/dL (ref >39.00)
LDL Cholesterol: 43 mg/dL (ref 0–99)
NonHDL: 50.25
Total CHOL/HDL Ratio: 2
Triglycerides: 38 mg/dL (ref 0.0–149.0)
VLDL: 7.6 mg/dL (ref 0.0–40.0)

## 2020-12-03 LAB — PSA: PSA: 4.01 ng/mL — ABNORMAL HIGH (ref 0.10–4.00)

## 2020-12-03 LAB — HEPATIC FUNCTION PANEL
ALT: 13 U/L (ref 0–53)
AST: 19 U/L (ref 0–37)
Albumin: 4.2 g/dL (ref 3.5–5.2)
Alkaline Phosphatase: 50 U/L (ref 39–117)
Bilirubin, Direct: 0.1 mg/dL (ref 0.0–0.3)
Total Bilirubin: 0.5 mg/dL (ref 0.2–1.2)
Total Protein: 6.6 g/dL (ref 6.0–8.3)

## 2020-12-03 LAB — BASIC METABOLIC PANEL
BUN: 17 mg/dL (ref 6–23)
CO2: 29 mEq/L (ref 19–32)
Calcium: 9.2 mg/dL (ref 8.4–10.5)
Chloride: 100 mEq/L (ref 96–112)
Creatinine, Ser: 1.01 mg/dL (ref 0.40–1.50)
GFR: 76.34 mL/min (ref >60.00)
Glucose, Bld: 103 mg/dL — ABNORMAL HIGH (ref 70–99)
Potassium: 4.6 mEq/L (ref 3.5–5.1)
Sodium: 135 mEq/L (ref 135–145)

## 2020-12-03 LAB — TESTOSTERONE: Testosterone: 455.88 ng/dL (ref 300.00–890.00)

## 2020-12-03 MED ORDER — AMOXICILLIN-POT CLAVULANATE 875-125 MG PO TABS
1.0000 | ORAL_TABLET | Freq: Two times a day (BID) | ORAL | 0 refills | Status: DC
  Filled 2020-12-03: qty 14, 7d supply, fill #0

## 2021-02-01 ENCOUNTER — Other Ambulatory Visit (HOSPITAL_COMMUNITY): Payer: Self-pay

## 2021-02-01 ENCOUNTER — Other Ambulatory Visit: Payer: Self-pay | Admitting: Family Medicine

## 2021-02-01 MED ORDER — ROSUVASTATIN CALCIUM 40 MG PO TABS
40.0000 mg | ORAL_TABLET | Freq: Every day | ORAL | 3 refills | Status: DC
Start: 2021-02-01 — End: 2022-02-13
  Filled 2021-02-01: qty 22, 22d supply, fill #0
  Filled 2021-02-01: qty 68, 68d supply, fill #0
  Filled 2021-05-06: qty 90, 90d supply, fill #1
  Filled 2021-11-01: qty 90, 90d supply, fill #2
  Filled 2022-01-26: qty 90, 90d supply, fill #3

## 2021-02-01 MED ORDER — CANDESARTAN CILEXETIL 32 MG PO TABS
32.0000 mg | ORAL_TABLET | Freq: Every day | ORAL | 1 refills | Status: DC
  Filled 2021-02-01: qty 90, 90d supply, fill #0
  Filled 2021-05-06: qty 90, 90d supply, fill #1

## 2021-02-05 ENCOUNTER — Telehealth: Payer: Self-pay | Admitting: Internal Medicine

## 2021-02-05 DIAGNOSIS — Z86010 Personal history of colonic polyps: Secondary | ICD-10-CM

## 2021-02-05 NOTE — Telephone Encounter (Signed)
Messaged about waiting 2 years to repeat colonoscopy

## 2021-02-11 ENCOUNTER — Other Ambulatory Visit (HOSPITAL_COMMUNITY): Payer: Self-pay

## 2021-02-11 MED ORDER — AMOXICILLIN 500 MG PO CAPS
500.0000 mg | ORAL_CAPSULE | ORAL | 0 refills | Status: DC
  Filled 2021-02-11: qty 21, 7d supply, fill #0

## 2021-02-11 NOTE — Telephone Encounter (Signed)
Recall updated  to December 2024  for colonoscopy. My Chart message sent to pt to to inform of the change.

## 2021-02-11 NOTE — Telephone Encounter (Signed)
We will change recall to December 2024, which will be 7 years since last colonoscopy which I think is in keeping with the current guidelines and the patient has been informed.   Please update the recall to December 2024 colonoscopy

## 2021-03-14 DIAGNOSIS — R972 Elevated prostate specific antigen [PSA]: Secondary | ICD-10-CM | POA: Diagnosis not present

## 2021-03-14 DIAGNOSIS — N529 Male erectile dysfunction, unspecified: Secondary | ICD-10-CM | POA: Diagnosis not present

## 2021-03-14 DIAGNOSIS — N138 Other obstructive and reflux uropathy: Secondary | ICD-10-CM | POA: Diagnosis not present

## 2021-03-14 DIAGNOSIS — N401 Enlarged prostate with lower urinary tract symptoms: Secondary | ICD-10-CM | POA: Diagnosis not present

## 2021-03-26 ENCOUNTER — Other Ambulatory Visit: Payer: Self-pay

## 2021-03-26 ENCOUNTER — Ambulatory Visit: Payer: 59 | Admitting: Dermatology

## 2021-03-26 DIAGNOSIS — L918 Other hypertrophic disorders of the skin: Secondary | ICD-10-CM

## 2021-03-26 DIAGNOSIS — Z1283 Encounter for screening for malignant neoplasm of skin: Secondary | ICD-10-CM | POA: Diagnosis not present

## 2021-03-26 DIAGNOSIS — L720 Epidermal cyst: Secondary | ICD-10-CM

## 2021-03-26 DIAGNOSIS — L570 Actinic keratosis: Secondary | ICD-10-CM | POA: Diagnosis not present

## 2021-03-26 DIAGNOSIS — L738 Other specified follicular disorders: Secondary | ICD-10-CM | POA: Diagnosis not present

## 2021-03-31 ENCOUNTER — Other Ambulatory Visit: Payer: Self-pay | Admitting: Family Medicine

## 2021-04-01 ENCOUNTER — Other Ambulatory Visit (HOSPITAL_COMMUNITY): Payer: Self-pay

## 2021-04-01 MED ORDER — TESTOSTERONE 1.62 % TD GEL
1.0000 | Freq: Every day | TRANSDERMAL | 5 refills | Status: DC
  Filled 2021-04-01: qty 225, 90d supply, fill #0
  Filled 2021-06-18 – 2021-06-28 (×3): qty 225, 90d supply, fill #1

## 2021-04-06 ENCOUNTER — Encounter: Payer: Self-pay | Admitting: Dermatology

## 2021-04-06 NOTE — Progress Notes (Signed)
? ?  Follow-Up Visit ?  ?Subjective  ?Shane Perkins is a 71 y.o. male who presents for the following: Annual Exam (Pt here for annual exam. Pt had a spot of concern on the L neck that "fell off" a little while ago. Pt states that it did hurt for sometime until it fell off). ? ?Skin check, check left neck where sore spot seem to fall off ?Location:  ?Duration:  ?Quality:  ?Associated Signs/Symptoms: ?Modifying Factors:  ?Severity:  ?Timing: ?Context:  ? ?Objective  ?Well appearing patient in no apparent distress; mood and affect are within normal limits. ?No sign atypical pigmented lesion or nonmelanoma skin cancer. ? ?Left Anterior Neck ?Pedunculated 1 mm flesh-colored papule ? ?Mid Forehead ?4 mm pink gritty crust ? ?Left Malar Cheek ?Half millimeter upper dermal white papule ? ?Head - Anterior (Face) ?Several 2 mm flesh-colored papules with eccentric umbilication, typical dermoscopy ? ?Neck - Anterior ?History of sensitive area that spontaneously cleared with no residual except subtle pink spot on examination today fits a lichenoid keratosis ? ? ? ?All skin waist up examined. ? ? ?Assessment & Plan  ? ? ?Screening exam for skin cancer ? ?Annual skin examination.  Encouraged to self examine with spouse (nurse) twice annually.  Continue ultraviolet protection. ? ?Skin tag ?Left Anterior Neck ? ?No intervention necessary ? ?Solar keratosis ?Mid Forehead ? ?Destruction of lesion - Mid Forehead ?Complexity: simple   ?Destruction method: cryotherapy   ?Informed consent: discussed and consent obtained   ?Timeout:  patient name, date of birth, surgical site, and procedure verified ?Lesion destroyed using liquid nitrogen: Yes   ?Cryotherapy cycles:  3 ?Outcome: patient tolerated procedure well with no complications   ?Post-procedure details: wound care instructions given   ? ?[place] ?Left Malar Cheek ? ?No intervention indicated ? ?Sebaceous hyperplasia of face ?Head - Anterior (Face) ? ?Told of similar appearance of early  [place] so if any lesions grow or bleed return for biopsy ? ?Lichenoid actinic keratosis ?Neck - Anterior ? ?Recheck if there is clinical worsening ? ? ? ? ? ?I, Lavonna Monarch, MD, have reviewed all documentation for this visit.  The documentation on 04/06/21 for the exam, diagnosis, procedures, and orders are all accurate and complete. ?

## 2021-05-06 ENCOUNTER — Other Ambulatory Visit (HOSPITAL_COMMUNITY): Payer: Self-pay

## 2021-05-13 ENCOUNTER — Ambulatory Visit: Payer: 59 | Admitting: Family Medicine

## 2021-05-13 VITALS — BP 110/78 | HR 69 | Temp 97.8°F | Ht 70.0 in | Wt 180.3 lb

## 2021-05-13 DIAGNOSIS — E785 Hyperlipidemia, unspecified: Secondary | ICD-10-CM

## 2021-05-13 DIAGNOSIS — Z8249 Family history of ischemic heart disease and other diseases of the circulatory system: Secondary | ICD-10-CM

## 2021-05-13 MED ORDER — REPATHA SURECLICK 140 MG/ML ~~LOC~~ SOAJ
140.0000 mg | SUBCUTANEOUS | 11 refills | Status: DC
  Filled 2021-05-13: qty 2, 30d supply, fill #0
  Filled 2021-06-18: qty 2, 30d supply, fill #1
  Filled 2021-07-15: qty 2, 30d supply, fill #2
  Filled 2021-08-11: qty 2, 30d supply, fill #3
  Filled 2021-09-13: qty 2, 30d supply, fill #4
  Filled 2021-10-03 – 2021-10-07 (×2): qty 2, 30d supply, fill #5
  Filled 2021-11-06: qty 2, 30d supply, fill #6
  Filled 2021-12-12: qty 2, 30d supply, fill #7
  Filled 2021-12-30 – 2022-01-06 (×2): qty 2, 30d supply, fill #8
  Filled 2022-01-26 – 2022-02-03 (×4): qty 2, 30d supply, fill #9

## 2021-05-13 NOTE — Progress Notes (Signed)
? ?Established Patient Office Visit ? ?Subjective   ?Patient ID: Shane Perkins, male    DOB: 08-Jan-1951  Age: 71 y.o. MRN: [id number] ? ?Chief Complaint  ?Patient presents with  ? Medication Consultation  ? ? ?HPI ? ?Family History  ?Problem Relation Age of Onset  ? Heart disease Mother   ? Hypertension Mother   ? Colon polyps Brother   ? Colon cancer Paternal Uncle   ?     possible.  not sure  ? Esophageal cancer Neg Hx   ? Pancreatic cancer Neg Hx   ? Prostate cancer Neg Hx   ? Rectal cancer Neg Hx   ? Stomach cancer Neg Hx   ? ?  ?Shane Perkins is seen to discuss hyperlipidemia treatment and in the setting of strong family history of premature CAD, previous coronary calcium score of 400, LP(a) 120.  He is currently on Crestor 40 mg daily.  Most recent lipid panel revealed total cholesterol 99, triglycerides 38, HDL 48, LDL 43.  He does not have any major muscle aches with Crestor but has had some significant muscle fatigue-especially with more vigorous exercise. ? ?Very strong family history of premature CAD as above.  His mother had coronary disease at 74 along with 76 year old uncle and 31 year old aunt as well as 56 year old cousin.  They all had significant CAD.  Back in January of last year he had a coronary calcium score of 405 which was 75th percentile for age.  He had mild nonobstructive CAD in the proximal LAD, minimal diffuse plaque in the RCA and left circumflex arteries and moderate plaque in the ostial portion of a small first diagonal. ? ?Given probable side effects with rosuvastatin and elevated LP(a), he would like to consider PCSK9 medication which can help to lower LP(a) and avoid side effects of statins. ? ?Past Medical History:  ?Diagnosis Date  ? Colon polyps   ? History of colonic polyps 08/13/2004  ? Qualifier: Diagnosis of  By: Carlean Purl MD, Dimas Millin   ? Hypertension   ? Low testosterone   ? Wears contact lenses   ? ?Past Surgical History:  ?Procedure Laterality Date  ? COLONOSCOPY    ? HAMMERTOE  RECONSTRUCTION WITH WEIL OSTEOTOMY Right 03/16/2014  ? Procedure: RIGHT SECOND/THIRD METATARSAL WEIL OSTEOSTOMY AND HAMMERTOE CORRECTION;  Surgeon: Wylene Simmer, MD;  Location: [facility];  Service: Orthopedics;  Laterality: Right;  ANESTHESIA: GENERAL/REGIONAL BLOCK  ? PROSTATE SURGERY  2010  ? bx  ? [place]  ? ? reports that he has never smoked. He has never used smokeless tobacco. He reports current alcohol use. He reports that he does not use drugs. ?family history includes Colon cancer in his paternal uncle; Colon polyps in his brother; Heart disease in his mother; Hypertension in his mother. ?No Active Allergies ? ?Review of Systems  ?Respiratory:  Negative for shortness of breath.   ?Cardiovascular:  Negative for chest pain.  ? ?  ?Objective:  ?  ? ?BP 110/78 (BP Location: Left Arm, Patient Position: Sitting, Cuff Size: Normal)   Pulse 69   Temp 97.8 ?F (36.6 ?C) (Oral)   Ht '5\' 10"'$  (1.778 m)   Wt 180 lb 4.8 oz (81.8 kg)   SpO2 95%   BMI 25.87 kg/m?  ? ? ?Physical Exam ?Vitals reviewed.  ?Cardiovascular:  ?   Rate and Rhythm: Normal rate and regular rhythm.  ? ? ? ?No results found for any visits on 05/13/21. ? ?Last lipids ?Lab  Results  ?Component Value Date  ? CHOL 99 12/03/2020  ? HDL 48.30 12/03/2020  ? [place] 43 12/03/2020  ? TRIG 38.0 12/03/2020  ? CHOLHDL 2 12/03/2020  ? ?  ? ?The ASCVD Risk score (Arnett DK, et al., 2019) failed to calculate for the following reasons: ?  The valid total cholesterol range is 130 to 320 mg/dL ? ?  ?Assessment & Plan:  ? ?Mild hypercholesterolemia with history of LP(a) 120, coronary calcium score 405, and strong family history of premature CAD.  History of muscle fatigue with rosuvastatin.  Needs aggressive risk factor intervention and lipid management ? ?-We discussed transitioning to PCSK9 with Repatha.  This should have added benefit of helping lower LP(a) and hopefully avoiding muscle fatigue issues with rosuvastatin. ? ?No  follow-ups on file.  ? ? ?Carolann Littler, MD ? ?

## 2021-05-14 ENCOUNTER — Other Ambulatory Visit (HOSPITAL_COMMUNITY): Payer: Self-pay

## 2021-05-17 ENCOUNTER — Other Ambulatory Visit (HOSPITAL_COMMUNITY): Payer: Self-pay

## 2021-06-18 ENCOUNTER — Other Ambulatory Visit (HOSPITAL_COMMUNITY): Payer: Self-pay

## 2021-06-21 ENCOUNTER — Other Ambulatory Visit (HOSPITAL_COMMUNITY): Payer: Self-pay

## 2021-06-21 ENCOUNTER — Other Ambulatory Visit: Payer: Self-pay | Admitting: Family Medicine

## 2021-06-21 MED ORDER — CANDESARTAN CILEXETIL 32 MG PO TABS
32.0000 mg | ORAL_TABLET | Freq: Every day | ORAL | 1 refills | Status: DC
  Filled 2021-06-21 – 2021-08-07 (×2): qty 90, 90d supply, fill #0
  Filled 2021-11-01: qty 90, 90d supply, fill #1

## 2021-06-28 ENCOUNTER — Other Ambulatory Visit (HOSPITAL_COMMUNITY): Payer: Self-pay

## 2021-07-04 ENCOUNTER — Other Ambulatory Visit (HOSPITAL_COMMUNITY): Payer: Self-pay

## 2021-07-15 ENCOUNTER — Other Ambulatory Visit (HOSPITAL_COMMUNITY): Payer: Self-pay

## 2021-08-07 ENCOUNTER — Other Ambulatory Visit (HOSPITAL_COMMUNITY): Payer: Self-pay

## 2021-08-12 ENCOUNTER — Ambulatory Visit (INDEPENDENT_AMBULATORY_CARE_PROVIDER_SITE_OTHER): Payer: 59

## 2021-08-12 ENCOUNTER — Ambulatory Visit (INDEPENDENT_AMBULATORY_CARE_PROVIDER_SITE_OTHER): Payer: 59 | Admitting: Family Medicine

## 2021-08-12 ENCOUNTER — Encounter: Payer: Self-pay | Admitting: Family Medicine

## 2021-08-12 ENCOUNTER — Other Ambulatory Visit (HOSPITAL_COMMUNITY): Payer: Self-pay

## 2021-08-12 VITALS — BP 124/80 | HR 59 | Temp 97.4°F | Wt 188.2 lb

## 2021-08-12 DIAGNOSIS — M545 Low back pain, unspecified: Secondary | ICD-10-CM

## 2021-08-12 DIAGNOSIS — M25562 Pain in left knee: Secondary | ICD-10-CM

## 2021-08-12 NOTE — Progress Notes (Unsigned)
Established Patient Office Visit  Subjective   Patient ID: Shane Perkins, male    DOB: 1951/01/02  Age: 71 y.o. MRN: [id number]  No chief complaint on file.   HPI  {History (Optional):23778} Shane Perkins is seen with pain around the left knee region but also somewhat diffusely involving the left lower extremity.  He was playing tennis couple weeks ago and went to do a forehand shot and felt his left knee buckle.  He did not finish the match.  He did not notice any immediate instability or immediate swelling after that but has had some somewhat poorly localized discomfort around the left knee since then.  He is also had some intermittent pains left thigh and paresthesias involving the left foot and left lateral leg.  Denies any locking or giving way of the knee since then.  He does also have some chronic knee difficulties on the right side and apparently had meniscus tear in the right knee previously.   Has seen sports medicine previously for that. MRI 2015 showed torn meniscus of the right knee and also apparently some tricompartmental arthritis but not severe  Recently went on about a mile and a half walk with his wife and knee felt somewhat worse afterwards.  Does have occasional lumbar back pain.  No consistent radiculitis pain.  He had some pains in the left proximal lower extremity and does radiate at times down toward the leg.  No urine or stool incontinence.  He had MRI lumbar spine way back in 2008 which showed mild disc bulge L4-5 without nerve root impingement.  Mild facet arthropathy at L5-S1 without nerve root impingement.  Has taken some over-the-counter nonsteroidals.  Past Medical History:  Diagnosis Date   Colon polyps    History of colonic polyps 08/13/2004   Qualifier: Diagnosis of  By: Carlean Purl MD, Tonna Boehringer E    Hypertension    Low testosterone    Wears contact lenses    Past Surgical History:  Procedure Laterality Date   COLONOSCOPY     HAMMERTOE RECONSTRUCTION WITH WEIL  OSTEOTOMY Right 03/16/2014   Procedure: RIGHT SECOND/THIRD METATARSAL WEIL OSTEOSTOMY AND HAMMERTOE CORRECTION;  Surgeon: Wylene Simmer, MD;  Location: [facility];  Service: Orthopedics;  Laterality: Right;  ANESTHESIA: GENERAL/REGIONAL BLOCK   PROSTATE SURGERY  2010   bx   [place]    reports that he has never smoked. He has never used smokeless tobacco. He reports current alcohol use. He reports that he does not use drugs. family history includes Colon cancer in his paternal uncle; Colon polyps in his brother; Heart disease in his mother; Hypertension in his mother. No Active Allergies  Review of Systems  Constitutional:  Negative for chills and fever.  Neurological:  Negative for weakness.      Objective:     BP 124/80 (BP Location: Left Arm, Patient Position: Sitting, Cuff Size: Normal)   Pulse (!) 59   Temp (!) 97.4 F (36.3 C) (Oral)   Wt 188 lb 3.2 oz (85.4 kg)   SpO2 98%   BMI 27.00 kg/m  {Vitals History (Optional):23777}  Physical Exam Vitals reviewed.  Constitutional:      Appearance: Normal appearance.  Musculoskeletal:     Comments: Left knee reveals no palpable effusion.  No ecchymosis.  No erythema.  Mild medial joint line tenderness.  Ligaments stable.  Mild crepitus with flexion extension.  Straight leg raise is negative.  Neurological:     Mental Status: He  is alert.     Comments: Good strength with plantarflexion, dorsiflexion, knee extension on the left. 1+ knee reflex bilaterally and trace right with minimal left Achilles      No results found for any visits on 08/12/21.  {Labs (Optional):23779}  The ASCVD Risk score (Arnett DK, et al., 2019) failed to calculate for the following reasons:   The valid total cholesterol range is 130 to 320 mg/dL    Assessment & Plan:   Patient presents with left lower extremity pain.  Intermittent left knee pain following tennis event as above couple weeks ago.  He does have some pain  though however extending from proximal thigh down occasionally toward the leg with some intermittent mild paresthesias involving the lateral leg and even dorsum of foot.  May have a couple of things going on.  Query meniscus injury left knee but also may have some mild L5-S1 nerve root involvement.  Negative straight leg raise as above. We decided to go ahead with lumbosacral films to look for any disc space narrowing  -Watch for any progressive paresthesias, weakness, or any progressive pain -We did discuss getting back in to see sports medicine whom he has seen frequently in the past for other knee issues   No follow-ups on file.    Carolann Littler, MD

## 2021-08-14 ENCOUNTER — Ambulatory Visit: Payer: 59 | Admitting: Family Medicine

## 2021-08-14 ENCOUNTER — Other Ambulatory Visit (HOSPITAL_COMMUNITY): Payer: Self-pay

## 2021-08-14 VITALS — BP 130/90 | Ht 69.0 in | Wt 179.0 lb

## 2021-08-14 DIAGNOSIS — M5416 Radiculopathy, lumbar region: Secondary | ICD-10-CM | POA: Diagnosis not present

## 2021-08-14 MED ORDER — PREDNISONE 10 MG PO TABS
ORAL_TABLET | ORAL | 0 refills | Status: AC
  Filled 2021-08-14: qty 21, 6d supply, fill #0

## 2021-08-14 NOTE — Patient Instructions (Signed)
I'm concerned you have a lower lumbar radiculopathy. Take prednisone dose pack x 6 days as directed. We will go ahead with an MRI to further assess and I'll call you with results, next steps. Home exercises - I would wait on formal physical therapy to see how you do with the prednisone. Follow up will depend on the MRI results.

## 2021-08-15 ENCOUNTER — Ambulatory Visit
Admission: RE | Admit: 2021-08-15 | Discharge: 2021-08-15 | Disposition: A | Discharge status: Home / Self Care | Payer: 59 | Source: Ambulatory Visit | Attending: Family Medicine | Admitting: Family Medicine

## 2021-08-15 DIAGNOSIS — M5116 Intervertebral disc disorders with radiculopathy, lumbar region: Secondary | ICD-10-CM | POA: Diagnosis not present

## 2021-08-15 DIAGNOSIS — M4727 Other spondylosis with radiculopathy, lumbosacral region: Secondary | ICD-10-CM | POA: Diagnosis not present

## 2021-08-15 DIAGNOSIS — M5416 Radiculopathy, lumbar region: Secondary | ICD-10-CM

## 2021-08-16 NOTE — Progress Notes (Signed)
PCP: [name], MD  Subjective:   HPI: Patient is a 71 y.o. male here for left leg pain.  Patient has been seen previously for right knee pain, diagnosed with arthritis. Current pain is in the left leg however. Recalls falling when going to hit a forehand about 2.5 weeks ago and unsure if this led to current issue. Has had tightness in proximal left leg/thigh. Worse when up and walking around - cannot walk 500 yards now due to discomfort in left leg. Then for the past 5-7 days has noticed numbness in left foot only. No bowel/bladder dysfunction.  Past Medical History:  Diagnosis Date   Colon polyps    History of colonic polyps 08/13/2004   Qualifier: Diagnosis of  By: Carlean Purl MD, Dimas Millin    Hypertension    Low testosterone    Wears contact lenses     Current Outpatient Medications on File Prior to Visit  Medication Sig Dispense Refill   candesartan (ATACAND) 32 MG tablet Take 1 tablet (32 mg total) by mouth daily. 90 tablet 1   EPINEPHrine (EPIPEN 2-PAK) 0.3 mg/0.3 mL IJ SOAJ injection Inject 0.3 mg into the muscle as needed for anaphylaxis. 2 each 1   Evolocumab (REPATHA SURECLICK) 638 MG/ML SOAJ Inject 140 mg into the skin every 14 (fourteen) days. 2 mL 11   rosuvastatin (CRESTOR) 40 MG tablet Take 1 tablet (40 mg total) by mouth daily. 90 tablet 3   sildenafil (VIAGRA) 100 MG tablet TAKE 1/2 TO 1 TABLET BY MOUTH DAILY AS NEEDED FOR ERECTILE DYSFUNCTION 6 tablet 11   Testosterone 1.62 % GEL Apply 1 pump topically to each arm daily as directed 225 g 5   Current Facility-Administered Medications on File Prior to Visit  Medication Dose Route Frequency Provider Last Rate Last Admin   0.9 %  sodium chloride infusion  500 mL Intravenous Continuous Gatha Mayer, MD        Past Surgical History:  Procedure Laterality Date   COLONOSCOPY     HAMMERTOE RECONSTRUCTION WITH WEIL OSTEOTOMY Right 03/16/2014   Procedure: RIGHT SECOND/THIRD METATARSAL WEIL OSTEOSTOMY AND  HAMMERTOE CORRECTION;  Surgeon: Wylene Simmer, MD;  Location: [facility];  Service: Orthopedics;  Laterality: Right;  ANESTHESIA: GENERAL/REGIONAL BLOCK   PROSTATE SURGERY  2010   bx   VARIOCOCELE REPAIR  1985    No Active Allergies  BP (!) 130/90   Ht '5\' 9"'$  (1.753 m)   Wt 179 lb (81.2 kg)   BMI 26.43 kg/m      08/14/2021    3:21 PM  [place] [facility] Exercise  Frequency of aerobic exercise (# of days/week) 7  Average time in minutes 60  Frequency of strengthening activities (# of days/week) 3        No data to display              Objective:  Physical Exam:  Gen: NAD, comfortable in exam room  Back: No gross deformity, scoliosis. No paraspinal, SI joint TTP.  No midline or bony TTP. FROM. Strength LEs 5/5 all muscle groups.   2+ MSRs in patellar and 1+ achilles tendons, equal bilaterally. Negative SLRs. Sensation intact to light touch bilaterally.  Left hip: No deformity. FROM with 5/5 strength. No tenderness to palpation greater trochanter, about hip. NVI distally. Negative logroll   Assessment & Plan:  1. Left leg pain - history, distribution concerning for left lumbar radiculopathy.  Exam reassuring and no red flags.  Will start with prednisone dose pack, MRI to assess for origin of radiculopathy.  Wait on home exercises, physical therapy based on MRI.

## 2021-08-26 NOTE — Telephone Encounter (Signed)
error 

## 2021-09-13 ENCOUNTER — Other Ambulatory Visit (HOSPITAL_COMMUNITY): Payer: Self-pay

## 2021-09-30 ENCOUNTER — Ambulatory Visit (INDEPENDENT_AMBULATORY_CARE_PROVIDER_SITE_OTHER): Payer: 59 | Admitting: Family Medicine

## 2021-09-30 ENCOUNTER — Ambulatory Visit: Payer: Self-pay

## 2021-09-30 VITALS — BP 120/70 | Ht 69.0 in

## 2021-09-30 DIAGNOSIS — M25562 Pain in left knee: Secondary | ICD-10-CM | POA: Diagnosis not present

## 2021-09-30 NOTE — Patient Instructions (Signed)
Your pain and swelling are due to degenerative medial meniscus tear, a component of arthritis as well. These are the different medications you can take for this: Tylenol '500mg'$  1-2 tabs three times a day for pain. Capsaicin, aspercreme, or biofreeze topically up to four times a day may also help with pain. Aleve 1-2 tabs twice a day with food for short term (7-14 days then as needed). Cortisone injections are an option if you're struggling. It's important that you continue to stay active. Straight leg raises, knee extensions 3 sets of 10 once a day (add ankle weight if these become too easy). Consider physical therapy to strengthen muscles around the joint that hurts to take pressure off of the joint itself. Shoe inserts with good arch support may be helpful. Ice 15 minutes at a time 3-4 times a day as needed to help with pain. Follow up with me in 1 month

## 2021-10-01 ENCOUNTER — Encounter: Payer: Self-pay | Admitting: Family Medicine

## 2021-10-01 NOTE — Progress Notes (Signed)
PCP: [name], MD  Subjective:   HPI: Patient is a 71 y.o. male here for left knee pain.  Patient reports back pain has improved. At last visit his left knee was bothering him too but wasn't sure if it was related to back or not. States left knee feels achy all day and night. Feels pain medial and medial to patella. Worse with going down stairs. Limiting his activity level. + night pain.  Past Medical History:  Diagnosis Date   Colon polyps    History of colonic polyps 08/13/2004   Qualifier: Diagnosis of  By: Carlean Purl MD, Dimas Millin    Hypertension    Low testosterone    Wears contact lenses     Current Outpatient Medications on File Prior to Visit  Medication Sig Dispense Refill   candesartan (ATACAND) 32 MG tablet Take 1 tablet (32 mg total) by mouth daily. 90 tablet 1   EPINEPHrine (EPIPEN 2-PAK) 0.3 mg/0.3 mL IJ SOAJ injection Inject 0.3 mg into the muscle as needed for anaphylaxis. 2 each 1   Evolocumab (REPATHA SURECLICK) 275 MG/ML SOAJ Inject 140 mg into the skin every 14 (fourteen) days. 2 mL 11   rosuvastatin (CRESTOR) 40 MG tablet Take 1 tablet (40 mg total) by mouth daily. 90 tablet 3   sildenafil (VIAGRA) 100 MG tablet TAKE 1/2 TO 1 TABLET BY MOUTH DAILY AS NEEDED FOR ERECTILE DYSFUNCTION 6 tablet 11   Testosterone 1.62 % GEL Apply 1 pump topically to each arm daily as directed 225 g 5   Current Facility-Administered Medications on File Prior to Visit  Medication Dose Route Frequency Provider Last Rate Last Admin   0.9 %  sodium chloride infusion  500 mL Intravenous Continuous Gatha Mayer, MD        Past Surgical History:  Procedure Laterality Date   COLONOSCOPY     HAMMERTOE RECONSTRUCTION WITH WEIL OSTEOTOMY Right 03/16/2014   Procedure: RIGHT SECOND/THIRD METATARSAL WEIL OSTEOSTOMY AND HAMMERTOE CORRECTION;  Surgeon: Wylene Simmer, MD;  Location: [facility];  Service: Orthopedics;  Laterality: Right;  ANESTHESIA: GENERAL/REGIONAL  BLOCK   PROSTATE SURGERY  2010   bx   VARIOCOCELE REPAIR  1985    No Active Allergies  BP 120/70   Ht '5\' 9"'$  (1.753 m)   BMI 26.43 kg/m      08/14/2021    3:21 PM  [place] [facility] Exercise  Frequency of aerobic exercise (# of days/week) 7  Average time in minutes 60  Frequency of strengthening activities (# of days/week) 3        No data to display              Objective:  Physical Exam:  Gen: NAD, comfortable in exam room  Left knee: Mild effusion.  No other gross deformity, ecchymoses. TTP medial joint line.  No other tenderness. FROM with normal strength. Negative ant/post drawers. Negative valgus/varus testing. Negative lachman.  Negative mcmurrays, apleys.  Mild discomfort with thessalys. NV intact distally.  Limited MSK u/s left knee:  Mild effusion.  Small baker's cyst.  Degenerative tear of medial meniscus with intrameniscal calcific changes.  Narrowing medial compartment without significant spurring.  Assessment & Plan:  1. Left knee pain - 2/2 degenerative meniscus tear with underlying arthropathy. Home exercises reviewed.  Icing, tylenol, topical medications, short tern aleve.  Consider steroid injection, physical therapy.  F/u in 1 month.

## 2021-10-04 ENCOUNTER — Other Ambulatory Visit (HOSPITAL_COMMUNITY): Payer: Self-pay

## 2021-10-07 ENCOUNTER — Ambulatory Visit: Payer: 59 | Admitting: Family Medicine

## 2021-10-07 VITALS — BP 140/86 | Ht 69.0 in

## 2021-10-07 DIAGNOSIS — M25562 Pain in left knee: Secondary | ICD-10-CM | POA: Diagnosis not present

## 2021-10-07 MED ORDER — METHYLPREDNISOLONE ACETATE 40 MG/ML IJ SUSP
40.0000 mg | Freq: Once | INTRAMUSCULAR | Status: AC
  Administered 2021-10-07: 40 mg via INTRA_ARTICULAR

## 2021-10-08 ENCOUNTER — Other Ambulatory Visit (HOSPITAL_COMMUNITY): Payer: Self-pay

## 2021-10-08 ENCOUNTER — Encounter: Payer: Self-pay | Admitting: Family Medicine

## 2021-10-08 NOTE — Progress Notes (Unsigned)
PCP: [name], MD  Subjective:   HPI: Patient is a 71 y.o. male here for left knee pain.  9/18: Patient reports back pain has improved. At last visit his left knee was bothering him too but wasn't sure if it was related to back or not. States left knee feels achy all day and night. Feels pain medial and medial to patella. Worse with going down stairs. Limiting his activity level. + night pain.  9/25: Patient reports unfortunately pain has worsened since last visit. Pain still medial, worse with palpation of joint line. No new injuries.  Past Medical History:  Diagnosis Date   Colon polyps    History of colonic polyps 08/13/2004   Qualifier: Diagnosis of  By: Carlean Purl MD, Dimas Millin    Hypertension    Low testosterone    Wears contact lenses     Current Outpatient Medications on File Prior to Visit  Medication Sig Dispense Refill   candesartan (ATACAND) 32 MG tablet Take 1 tablet (32 mg total) by mouth daily. 90 tablet 1   EPINEPHrine (EPIPEN 2-PAK) 0.3 mg/0.3 mL IJ SOAJ injection Inject 0.3 mg into the muscle as needed for anaphylaxis. 2 each 1   Evolocumab (REPATHA SURECLICK) 379 MG/ML SOAJ Inject 140 mg into the skin every 14 (fourteen) days. 2 mL 11   rosuvastatin (CRESTOR) 40 MG tablet Take 1 tablet (40 mg total) by mouth daily. 90 tablet 3   sildenafil (VIAGRA) 100 MG tablet TAKE 1/2 TO 1 TABLET BY MOUTH DAILY AS NEEDED FOR ERECTILE DYSFUNCTION 6 tablet 11   Testosterone 1.62 % GEL Apply 1 pump topically to each arm daily as directed 225 g 5   Current Facility-Administered Medications on File Prior to Visit  Medication Dose Route Frequency Provider Last Rate Last Admin   0.9 %  sodium chloride infusion  500 mL Intravenous Continuous Gatha Mayer, MD        Past Surgical History:  Procedure Laterality Date   COLONOSCOPY     HAMMERTOE RECONSTRUCTION WITH WEIL OSTEOTOMY Right 03/16/2014   Procedure: RIGHT SECOND/THIRD METATARSAL WEIL OSTEOSTOMY AND  HAMMERTOE CORRECTION;  Surgeon: Wylene Simmer, MD;  Location: [facility];  Service: Orthopedics;  Laterality: Right;  ANESTHESIA: GENERAL/REGIONAL BLOCK   PROSTATE SURGERY  2010   bx   VARIOCOCELE REPAIR  1985    No Active Allergies  BP (!) 140/86   Ht '5\' 9"'$  (1.753 m)   BMI 26.43 kg/m      08/14/2021    3:21 PM  [place] [facility] Exercise  Frequency of aerobic exercise (# of days/week) 7  Average time in minutes 60  Frequency of strengthening activities (# of days/week) 3        No data to display              Objective:  Physical Exam:  Gen: NAD, comfortable in exam room  Left knee: Mild effusion.  No other gross deformity, ecchymoses. TTP medial joint line. FROM. Negative ant/post drawers. Negative valgus/varus testing. Negative lachman.  Negative mcmurrays, apleys. NV intact distally.  Assessment & Plan:  1. Left knee pain - Pain worsened since last visit.  Injection given today for pain relief.  Will go ahead with MRI to assess severity of his degenerative meniscus tear, consider ortho referral for arthroscopy.  After informed written consent timeout was performed, patient was lying supine on exam table. Left knee was prepped with alcohol swab and utilizing superolateral approach with ultrasound  guidance, patient's left knee was injected intraarticularly with 3:1 lidocaine: depomedrol. Patient tolerated the procedure well without immediate complications.

## 2021-10-11 ENCOUNTER — Ambulatory Visit
Admission: RE | Admit: 2021-10-11 | Discharge: 2021-10-11 | Disposition: A | Discharge status: Home / Self Care | Payer: 59 | Source: Ambulatory Visit | Attending: Family Medicine | Admitting: Family Medicine

## 2021-10-11 DIAGNOSIS — S83242A Other tear of medial meniscus, current injury, left knee, initial encounter: Secondary | ICD-10-CM | POA: Diagnosis not present

## 2021-10-11 DIAGNOSIS — M25562 Pain in left knee: Secondary | ICD-10-CM

## 2021-10-17 DIAGNOSIS — M25562 Pain in left knee: Secondary | ICD-10-CM | POA: Diagnosis not present

## 2021-10-30 ENCOUNTER — Ambulatory Visit: Payer: 59 | Admitting: Family Medicine

## 2021-10-30 VITALS — BP 121/74 | Ht 69.0 in | Wt 175.0 lb

## 2021-10-30 DIAGNOSIS — M25562 Pain in left knee: Secondary | ICD-10-CM

## 2021-10-31 ENCOUNTER — Encounter: Payer: Self-pay | Admitting: Family Medicine

## 2021-10-31 NOTE — Progress Notes (Signed)
Patient returned today for discussion regarding next steps.  He saw Dr. Berenice Primas, had standing radiographs of his knees - noted arthritis of his left knee in addition to his medial meniscus tear.  They discussed arthroscopy vs arthroplasty.  Dr. Berenice Primas advised may get about 3 years possibly out of debridement but questioned if this would be worth it vs going ahead with arthroplasty.  Dr. Lia Foyer and I discussed his goals - wants to be active but not with running going forward (with arthroplasty would advise against this).  He is going out of the country for 5 weeks also starting in February.  Question whether if he went through with arthroplasty he would be able to do this trip which will involve some hiking.  Expressed concern this would be pushing it - better chances (as he's doing pretty well currently as injection has kicked in) of making the trip if we treat conservatively and he has arthroplasty afterwards even if it means repeating steroid injection prior to his trip.  Answered questions.  Total visit time 15 minutes including documentation.

## 2021-11-01 ENCOUNTER — Other Ambulatory Visit (HOSPITAL_COMMUNITY): Payer: Self-pay

## 2021-11-01 ENCOUNTER — Other Ambulatory Visit: Payer: Self-pay | Admitting: Family Medicine

## 2021-11-01 MED ORDER — TESTOSTERONE 1.62 % TD GEL
1.0000 | Freq: Every day | TRANSDERMAL | 5 refills | Status: DC
  Filled 2021-11-01: qty 225, 90d supply, fill #0
  Filled 2022-01-26 – 2022-02-13 (×3): qty 225, 90d supply, fill #1

## 2021-11-01 NOTE — Telephone Encounter (Signed)
Last Ov-07/15/21 Last refill-04/01/21  No future OV scheduled.

## 2021-11-01 NOTE — Telephone Encounter (Signed)
Refilled testosterone for 6 months.  Does need follow-up CBC and PSA soon  [name] MD [facility]

## 2021-11-04 ENCOUNTER — Other Ambulatory Visit (HOSPITAL_COMMUNITY): Payer: Self-pay

## 2021-11-06 ENCOUNTER — Other Ambulatory Visit (HOSPITAL_COMMUNITY): Payer: Self-pay

## 2021-11-21 DIAGNOSIS — M25562 Pain in left knee: Secondary | ICD-10-CM | POA: Diagnosis not present

## 2021-11-21 DIAGNOSIS — M25561 Pain in right knee: Secondary | ICD-10-CM | POA: Diagnosis not present

## 2021-11-21 DIAGNOSIS — M170 Bilateral primary osteoarthritis of knee: Secondary | ICD-10-CM | POA: Diagnosis not present

## 2021-12-10 ENCOUNTER — Ambulatory Visit (INDEPENDENT_AMBULATORY_CARE_PROVIDER_SITE_OTHER): Payer: 59 | Admitting: Family Medicine

## 2021-12-10 ENCOUNTER — Other Ambulatory Visit (HOSPITAL_COMMUNITY): Payer: Self-pay

## 2021-12-10 ENCOUNTER — Encounter: Payer: Self-pay | Admitting: Family Medicine

## 2021-12-10 ENCOUNTER — Ambulatory Visit: Payer: 59 | Admitting: Family Medicine

## 2021-12-10 VITALS — BP 136/88 | HR 66 | Temp 97.6°F | Ht 70.0 in | Wt 187.4 lb

## 2021-12-10 DIAGNOSIS — Z0000 Encounter for general adult medical examination without abnormal findings: Secondary | ICD-10-CM

## 2021-12-10 MED ORDER — TADALAFIL 20 MG PO TABS
ORAL_TABLET | ORAL | 5 refills | Status: DC
  Filled 2021-12-10: qty 10, 20d supply, fill #0
  Filled 2021-12-31: qty 10, 20d supply, fill #1
  Filled 2022-01-26: qty 10, 20d supply, fill #2

## 2021-12-10 NOTE — Progress Notes (Signed)
Established Patient Office Visit  Subjective   Patient ID: Shane Perkins, male    DOB: Feb 13, 1950  Age: 71 y.o. MRN: [id number]  Chief Complaint  Patient presents with   Annual Exam    HPI   Here for physical exam/medical follow-up.  Past medical history significant for hypertension, dyslipidemia, low testosterone, elevated PSA, osteoarthritis involving the knees.  He has history of elevated LP(a) and went on Repatha earlier this year.  Tolerating well.  Has had some recent issues with knee pain particularly right knee.  Had steroid injection which did help some.  Anticipating trip this winter to [place] and [place] for 5 weeks.  He will consider repeat steroid injection prior to leaving-per sports medicine.  He has consulted with orthopedist regarding the knees and may eventually need knee replacement.  On topical testosterone replacement.  Needs follow-up CBC and PSA.  He has history of ED and has taken Viagra in the past.  Requesting change to Cialis..   He has had some balance issues occasionally in the dark and is requesting B12 level.  Has taken PPI in the past but not currently.  Stays very active with exercise.  Has been an avid tennis player in the past and play some pickleball.  Also enjoys hiking.  Health maintenance reviewed:  -Flu vaccine already given -Pneumonia vaccines complete -Due for repeat colonoscopy next year -He completed 1 Shingrix vaccine back in 2019 but never got second -Last tetanus was 2015  Past Medical History:  Diagnosis Date   Colon polyps    History of colonic polyps 08/13/2004   Qualifier: Diagnosis of  By: Carlean Purl MD, Tonna Boehringer E    Hypertension    Low testosterone    Wears contact lenses    Past Surgical History:  Procedure Laterality Date   COLONOSCOPY     HAMMERTOE RECONSTRUCTION WITH WEIL OSTEOTOMY Right 03/16/2014   Procedure: RIGHT SECOND/THIRD METATARSAL WEIL OSTEOSTOMY AND HAMMERTOE CORRECTION;  Surgeon: Wylene Simmer, MD;   Location: [facility];  Service: Orthopedics;  Laterality: Right;  ANESTHESIA: GENERAL/REGIONAL BLOCK   PROSTATE SURGERY  2010   bx   [place]    reports that he has never smoked. He has never used smokeless tobacco. He reports current alcohol use. He reports that he does not use drugs. family history includes Colon cancer in his paternal uncle; Colon polyps in his brother; Heart disease in his mother; Hypertension in his mother. No Active Allergies   Review of Systems  Constitutional:  Negative for malaise/fatigue and weight loss.  Eyes:  Negative for blurred vision.  Respiratory:  Negative for shortness of breath.   Cardiovascular:  Negative for chest pain.  Gastrointestinal:  Negative for abdominal pain.  Neurological:  Negative for dizziness, weakness and headaches.      Objective:     BP 136/88 (BP Location: Left Arm, Cuff Size: Normal)   Pulse 66   Temp 97.6 F (36.4 C) (Oral)   Ht '5\' 10"'$  (1.778 m)   Wt 187 lb 6.4 oz (85 kg)   SpO2 99%   BMI 26.89 kg/m  BP Readings from Last 3 Encounters:  12/10/21 136/88  10/30/21 121/74  10/07/21 (!) 140/86   Wt Readings from Last 3 Encounters:  12/10/21 187 lb 6.4 oz (85 kg)  10/30/21 175 lb (79.4 kg)  08/14/21 179 lb (81.2 kg)      Physical Exam Vitals reviewed.  Constitutional:      Appearance: Normal appearance.  Cardiovascular:     Rate and Rhythm: Normal rate and regular rhythm.     Heart sounds:     No gallop.  Pulmonary:     Effort: Pulmonary effort is normal.     Breath sounds: Normal breath sounds. No wheezing or rales.  Musculoskeletal:     Cervical back: Neck supple.  Lymphadenopathy:     Cervical: No cervical adenopathy.  Neurological:     Mental Status: He is alert.     No results found for any visits on 12/10/21.  Last CBC Lab Results  Component Value Date   WBC 4.9 12/03/2020   HGB 14.3 12/03/2020   HCT 42.0 12/03/2020   MCV 95.8 12/03/2020   RDW 12.8  12/03/2020   PLT 230.0 93/73/4287   Last metabolic panel Lab Results  Component Value Date   GLUCOSE 103 (H) 12/03/2020   NA 135 12/03/2020   K 4.6 12/03/2020   CL 100 12/03/2020   CO2 29 12/03/2020   BUN 17 12/03/2020   CREATININE 1.01 12/03/2020   GFRNONAA 76 (L) 03/15/2014   CALCIUM 9.2 12/03/2020   PROT 6.6 12/03/2020   ALBUMIN 4.2 12/03/2020   BILITOT 0.5 12/03/2020   ALKPHOS 50 12/03/2020   AST 19 12/03/2020   ALT 13 12/03/2020   ANIONGAP 7 03/15/2014   Last lipids Lab Results  Component Value Date   CHOL 99 12/03/2020   HDL 48.30 12/03/2020   LDLCALC 43 12/03/2020   TRIG 38.0 12/03/2020   CHOLHDL 2 12/03/2020      The ASCVD Risk score (Arnett DK, et al., 2019) failed to calculate for the following reasons:   The valid total cholesterol range is 130 to 320 mg/dL    Assessment & Plan:   Problem List Items Addressed This Visit   None Visit Diagnoses     Physical exam    -  Primary   Relevant Orders   Lipid panel   CBC with Differential/Platelet   Hepatic function panel   Basic metabolic panel   Vitamin G81   PSA     Initial blood pressure up today and did come down some with repeat.  He will monitor closely.  Send in prescription for Cialis 20 mg every other day as needed for ED  We did discuss Shingrix vaccine.  He had only 1 of 2 previously.  He declines at this time but would consider starting series over since he only got 1 of 2  Due for repeat colonoscopy next year  Discussed consideration for RSV vaccine  No follow-ups on file.    Carolann Littler, MD

## 2021-12-11 LAB — LIPID PANEL
Cholesterol: 100 mg/dL (ref 0–200)
HDL: 51.9 mg/dL (ref >39.00)
LDL Cholesterol: 30 mg/dL (ref 0–99)
NonHDL: 48.3
Total CHOL/HDL Ratio: 2
Triglycerides: 91 mg/dL (ref 0.0–149.0)
VLDL: 18.2 mg/dL (ref 0.0–40.0)

## 2021-12-11 LAB — CBC WITH DIFFERENTIAL/PLATELET
Basophils Absolute: 0 10*3/uL (ref 0.0–0.1)
Basophils Relative: 0.7 % (ref 0.0–3.0)
Eosinophils Absolute: 0.1 10*3/uL (ref 0.0–0.7)
Eosinophils Relative: 2.7 % (ref 0.0–5.0)
HCT: 43.9 % (ref 39.0–52.0)
Hemoglobin: 15.1 g/dL (ref 13.0–17.0)
Lymphocytes Relative: 39.5 % (ref 12.0–46.0)
Lymphs Abs: 1.6 10*3/uL (ref 0.7–4.0)
MCHC: 34.3 g/dL (ref 30.0–36.0)
MCV: 96.7 fl (ref 78.0–100.0)
Monocytes Absolute: 0.5 10*3/uL (ref 0.1–1.0)
Monocytes Relative: 12.7 % — ABNORMAL HIGH (ref 3.0–12.0)
Neutro Abs: 1.8 10*3/uL (ref 1.4–7.7)
Neutrophils Relative %: 44.4 % (ref 43.0–77.0)
Platelets: 229 10*3/uL (ref 150.0–400.0)
RBC: 4.54 Mil/uL (ref 4.22–5.81)
RDW: 13.5 % (ref 11.5–15.5)
WBC: 4 10*3/uL (ref 4.0–10.5)

## 2021-12-11 LAB — VITAMIN B12: Vitamin B-12: 245 pg/mL (ref 211–911)

## 2021-12-11 LAB — BASIC METABOLIC PANEL
BUN: 17 mg/dL (ref 6–23)
CO2: 30 mEq/L (ref 19–32)
Calcium: 9.4 mg/dL (ref 8.4–10.5)
Chloride: 98 mEq/L (ref 96–112)
Creatinine, Ser: 0.92 mg/dL (ref 0.40–1.50)
GFR: 84.78 mL/min (ref >60.00)
Glucose, Bld: 91 mg/dL (ref 70–99)
Potassium: 4 mEq/L (ref 3.5–5.1)
Sodium: 135 mEq/L (ref 135–145)

## 2021-12-11 LAB — HEPATIC FUNCTION PANEL
ALT: 15 U/L (ref 0–53)
AST: 18 U/L (ref 0–37)
Albumin: 4.4 g/dL (ref 3.5–5.2)
Alkaline Phosphatase: 45 U/L (ref 39–117)
Bilirubin, Direct: 0.2 mg/dL (ref 0.0–0.3)
Total Bilirubin: 0.8 mg/dL (ref 0.2–1.2)
Total Protein: 7 g/dL (ref 6.0–8.3)

## 2021-12-11 LAB — PSA: PSA: 5.64 ng/mL — ABNORMAL HIGH (ref 0.10–4.00)

## 2021-12-12 ENCOUNTER — Other Ambulatory Visit (HOSPITAL_COMMUNITY): Payer: Self-pay

## 2021-12-29 ENCOUNTER — Other Ambulatory Visit: Payer: Self-pay | Admitting: Family Medicine

## 2021-12-30 ENCOUNTER — Other Ambulatory Visit (HOSPITAL_COMMUNITY): Payer: Self-pay

## 2021-12-30 ENCOUNTER — Other Ambulatory Visit: Payer: Self-pay

## 2021-12-30 MED ORDER — CANDESARTAN CILEXETIL 32 MG PO TABS
32.0000 mg | ORAL_TABLET | Freq: Every day | ORAL | 1 refills | Status: DC
  Filled 2021-12-30 – 2022-01-26 (×4): qty 90, 90d supply, fill #0

## 2022-01-01 ENCOUNTER — Other Ambulatory Visit (HOSPITAL_COMMUNITY): Payer: Self-pay

## 2022-01-09 ENCOUNTER — Other Ambulatory Visit (HOSPITAL_COMMUNITY): Payer: Self-pay

## 2022-01-27 ENCOUNTER — Other Ambulatory Visit (HOSPITAL_COMMUNITY): Payer: Self-pay

## 2022-01-28 ENCOUNTER — Other Ambulatory Visit (HOSPITAL_COMMUNITY): Payer: Self-pay

## 2022-01-30 ENCOUNTER — Other Ambulatory Visit: Payer: Self-pay

## 2022-01-30 ENCOUNTER — Other Ambulatory Visit (HOSPITAL_COMMUNITY): Payer: Self-pay

## 2022-01-31 ENCOUNTER — Other Ambulatory Visit (HOSPITAL_COMMUNITY): Payer: Self-pay

## 2022-01-31 ENCOUNTER — Other Ambulatory Visit: Payer: Self-pay

## 2022-02-03 ENCOUNTER — Other Ambulatory Visit (HOSPITAL_COMMUNITY): Payer: Self-pay

## 2022-02-06 ENCOUNTER — Other Ambulatory Visit (HOSPITAL_COMMUNITY): Payer: Self-pay

## 2022-02-10 ENCOUNTER — Telehealth: Payer: Self-pay

## 2022-02-11 ENCOUNTER — Other Ambulatory Visit (HOSPITAL_COMMUNITY): Payer: Self-pay

## 2022-02-13 ENCOUNTER — Other Ambulatory Visit (HOSPITAL_COMMUNITY): Payer: Self-pay

## 2022-02-13 ENCOUNTER — Other Ambulatory Visit: Payer: Self-pay

## 2022-02-13 MED ORDER — ROSUVASTATIN CALCIUM 40 MG PO TABS: 40.0000 mg | ORAL_TABLET | Freq: Every day | ORAL | 3 refills | Status: DC

## 2022-02-13 MED ORDER — TADALAFIL 20 MG PO TABS: ORAL_TABLET | ORAL | 5 refills | Status: DC

## 2022-02-13 MED ORDER — TESTOSTERONE 1.62 % TD GEL: 1.0000 | Freq: Every day | TRANSDERMAL | 5 refills | Status: DC

## 2022-02-13 MED ORDER — EPINEPHRINE 0.3 MG/0.3ML IJ SOAJ: 0.3000 mg | INTRAMUSCULAR | 1 refills | Status: AC | PRN

## 2022-02-13 MED ORDER — REPATHA SURECLICK 140 MG/ML ~~LOC~~ SOAJ: 140.0000 mg | SUBCUTANEOUS | 11 refills | Status: DC

## 2022-02-13 MED ORDER — CANDESARTAN CILEXETIL 32 MG PO TABS: 32.0000 mg | ORAL_TABLET | Freq: Every day | ORAL | 1 refills | Status: DC

## 2022-02-13 NOTE — Addendum Note (Signed)
Addended by: Nilda Riggs on: 02/13/2022 01:04 PM   Modules accepted: Orders

## 2022-02-13 NOTE — Telephone Encounter (Signed)
Pharmacy Patient Advocate Encounter   Received notification that prior authorization for Testosterone 20.25 MG/ACT(1.62%) gel is required/requested.  Per Test Claim: PA required   PA submitted on 02/13/22 to (ins) [employer] via CoverMyMeds Key [id number] Status is pending

## 2022-02-13 NOTE — Addendum Note (Signed)
Addended by: [name] on: 02/13/2022 05:12 PM   Modules accepted: Orders

## 2022-02-13 NOTE — Telephone Encounter (Signed)
Patient Advocate Encounter  Prior Authorization for Testosterone 20.25 MG/ACT(1.62%) gel has been approved.    PA# [id number] Effective dates: 02/13/22 through 02/13/23

## 2022-02-13 NOTE — Telephone Encounter (Signed)
I sent in the testosterone to [facility].   Make sure all of his other non-controlled have been sent there as well.  Thanks!  [name] MD [facility]

## 2022-02-14 ENCOUNTER — Other Ambulatory Visit (HOSPITAL_COMMUNITY): Payer: Self-pay

## 2022-04-14 DIAGNOSIS — H43811 Vitreous degeneration, right eye: Secondary | ICD-10-CM | POA: Diagnosis not present

## 2022-04-23 ENCOUNTER — Encounter: Payer: Self-pay | Admitting: Family Medicine

## 2022-07-03 ENCOUNTER — Other Ambulatory Visit (HOSPITAL_COMMUNITY): Payer: Self-pay

## 2022-07-03 ENCOUNTER — Ambulatory Visit: Payer: Medicare Other | Admitting: Family Medicine

## 2022-07-03 ENCOUNTER — Ambulatory Visit (INDEPENDENT_AMBULATORY_CARE_PROVIDER_SITE_OTHER): Payer: Medicare Other | Admitting: Adult Health

## 2022-07-03 VITALS — BP 150/90 | HR 55 | Temp 98.3°F | Ht 70.0 in | Wt 185.0 lb

## 2022-07-03 DIAGNOSIS — M545 Low back pain, unspecified: Secondary | ICD-10-CM | POA: Diagnosis not present

## 2022-07-03 MED ORDER — METHYLPREDNISOLONE 4 MG PO TBPK
ORAL_TABLET | ORAL | 0 refills | Status: DC
  Filled 2022-07-03: qty 21, 6d supply, fill #0

## 2022-07-03 MED ORDER — CYCLOBENZAPRINE HCL 10 MG PO TABS
10.0000 mg | ORAL_TABLET | Freq: Every day | ORAL | 0 refills | Status: DC
  Filled 2022-07-03: qty 30, 30d supply, fill #0

## 2022-07-03 NOTE — Progress Notes (Signed)
Subjective:    Patient ID: Shane Perkins, male    DOB: [birth year], 72 y.o.   MRN: [id number]  HPI  72 year old male who  has a past medical history of Colon polyps, History of colonic polyps (08/13/2004), Hypertension, Low testosterone, and Wears contact lenses.  He is a patient of Dr. Caryl Never who I am seeing today for back pain. Pain started a few days ago. He denies trauma or injury but does golf and play tennis quite often. He reports pain is worse with laying down at night. Pain is alleviated with walking in tennis shoes with good foot support and stretching motions. He does not have any pain with internal or external rotation. He has not felt at clicking or popping in his hip. He does not have any radiculopathy symptoms.   He had an MRI of lumbar spine in August 2023 which showed mild degenerative changes with L4-L5 mild bilateral neural for minimal narrowing that was unchanged since prior exam in 2008.  He did not have any spinal stenosis.  Multilevel facet arthropathic   Review of Systems See HPI   Past Medical History:  Diagnosis Date   Colon polyps    History of colonic polyps 08/13/2004   Qualifier: Diagnosis of  By: Leone Payor MD, Alfonse Ras E    Hypertension    Low testosterone    Wears contact lenses     Social History   Socioeconomic History   Marital status: Married    Spouse name: Not on file   Number of children: Not on file   Years of education: Not on file   Highest education level: Professional school degree (e.g., MD, DDS, DVM, JD)  Occupational History   Not on file  Tobacco Use   Smoking status: Never   Smokeless tobacco: Never  Vaping Use   Vaping Use: Never used  Substance and Sexual Activity   Alcohol use: Yes    Comment: occ. 2 glasses wine month per pt   Drug use: No   Sexual activity: Not on file  Other Topics Concern   Not on file  Social History Narrative   Not on file   Social Determinants of Health   Financial Resource Strain: Low  Risk  (07/03/2022)   Overall Financial Resource Strain (CARDIA)    Difficulty of Paying Living Expenses: Not hard at all  Food Insecurity: No Food Insecurity (07/03/2022)   Hunger Vital Sign    Worried About Running Out of Food in the Last Year: Never true    Ran Out of Food in the Last Year: Never true  Transportation Needs: No Transportation Needs (07/03/2022)   PRAPARE - Administrator, Civil Service (Medical): No    Lack of Transportation (Non-Medical): No  Physical Activity: Sufficiently Active (07/03/2022)   Exercise Vital Sign    Days of Exercise per Week: 6 days    Minutes of Exercise per Session: 70 min  Stress: No Stress Concern Present (07/03/2022)   [employer] of Occupational Health - Occupational Stress Questionnaire    Feeling of Stress : Only a little  Social Connections: Socially Integrated (07/03/2022)   Social Connection and Isolation Panel [NHANES]    Frequency of Communication with Friends and Family: More than three times a week    Frequency of Social Gatherings with Friends and Family: Three times a week    Attends Religious Services: More than 4 times per year    Active Member of Clubs or Organizations:  Yes    Attends Club or Organization Meetings: More than 4 times per year    Marital Status: Married  Catering manager Violence: Not on file    Past Surgical History:  Procedure Laterality Date   COLONOSCOPY     HAMMERTOE RECONSTRUCTION WITH WEIL OSTEOTOMY Right 03/16/2014   Procedure: RIGHT SECOND/THIRD METATARSAL WEIL OSTEOSTOMY AND HAMMERTOE CORRECTION;  Surgeon: Toni Arthurs, MD;  Location: [facility];  Service: Orthopedics;  Laterality: Right;  ANESTHESIA: GENERAL/REGIONAL BLOCK   PROSTATE SURGERY  2010   bx   VARIOCOCELE REPAIR  1985    Family History  Problem Relation Age of Onset   Hypertension Mother    Colon polyps Brother    Colon cancer Paternal Uncle        possible.  not sure   Esophageal cancer Neg Hx     Pancreatic cancer Neg Hx    Prostate cancer Neg Hx    Rectal cancer Neg Hx    Stomach cancer Neg Hx     Allergies  Allergen Reactions   Red Dye Anaphylaxis    Current Outpatient Medications on File Prior to Visit  Medication Sig Dispense Refill   candesartan (ATACAND) 32 MG tablet Take 1 tablet (32 mg total) by mouth daily. 90 tablet 1   EPINEPHrine (EPIPEN 2-PAK) 0.3 mg/0.3 mL IJ SOAJ injection Inject 0.3 mg into the muscle as needed for anaphylaxis. 2 each 1   Evolocumab (REPATHA SURECLICK) 140 MG/ML SOAJ Inject 140 mg into the skin every 14 (fourteen) days. 2 mL 11   rosuvastatin (CRESTOR) 40 MG tablet Take 1 tablet (40 mg total) by mouth daily. 90 tablet 3   tadalafil (CIALIS) 20 MG tablet Take one tablet by mouth every other day as needed for erectile dysfunction. 10 tablet 5   Testosterone 1.62 % GEL Apply 1 pump topically to each arm daily as directed 225 g 5   Current Facility-Administered Medications on File Prior to Visit  Medication Dose Route Frequency Provider Last Rate Last Admin   0.9 %  sodium chloride infusion  500 mL Intravenous Continuous Iva Boop, MD        BP (!) 150/90   Pulse (!) 55   Temp 98.3 F (36.8 C) (Oral)   Ht 5\' 10"  (1.778 m)   Wt 185 lb (83.9 kg)   SpO2 98%   BMI 26.54 kg/m       Objective:   Physical Exam Vitals and nursing note reviewed.  Constitutional:      Appearance: Normal appearance.  Musculoskeletal:        General: Normal range of motion.     Lumbar back: Spasms and tenderness present. No bony tenderness. Normal range of motion.       Back:     Comments: Tight lower right lumbar paraspinal muscles. Tender to palpation.   Skin:    General: Skin is warm and dry.     Capillary Refill: Capillary refill takes less than 2 seconds.  Neurological:     General: No focal deficit present.     Mental Status: He is alert and oriented to person, place, and time.  Psychiatric:        Mood and Affect: Mood normal.         Behavior: Behavior normal.        Thought Content: Thought content normal.        Judgment: Judgment normal.        Assessment & Plan:  1. Acute  right-sided low back pain without sciatica - It does appear to be more muscular than disc related. No radiculopathy symptoms. No pain with rotation of the hip. Will send in medrol dose pack and Flexeril. Can consider imaging of low back and and hip in the future if not improving. Can also refer to PT if needed - cyclobenzaprine (FLEXERIL) 10 MG tablet; Take 1 tablet (10 mg total) by mouth at bedtime.  Dispense: 30 tablet; Refill: 0 - methylPREDNISolone (MEDROL DOSEPAK) 4 MG TBPK tablet; Take as directed  Dispense: 21 tablet; Refill: 0  Shirline Frees, NP

## 2022-07-07 ENCOUNTER — Ambulatory Visit (INDEPENDENT_AMBULATORY_CARE_PROVIDER_SITE_OTHER): Payer: Medicare Other | Admitting: Family Medicine

## 2022-07-07 VITALS — BP 116/70 | Ht 69.0 in

## 2022-07-07 DIAGNOSIS — M25551 Pain in right hip: Secondary | ICD-10-CM

## 2022-07-07 NOTE — Patient Instructions (Signed)
This sounds most like lumbar radiculopathy but SI joint dysfunction/sacroiliitis can cause similar symptoms in this distribution. Continue your prednisone until completed. Flexeril if needed. Heat as needed 15 minutes at a time 3-4 times a day. Do home exercises daily for the next 4-6 weeks. Continue with your core strengthening at least 3 times a week. Follow up with me in 1 month but if things go the wrong direction before this call me sooner.

## 2022-07-07 NOTE — Progress Notes (Unsigned)
PCP: Kristian Covey, MD  Subjective:   HPI: Patient is a 72 y.o. male here for right-sided posterior hip pain acutely worse over the past week or so, to the point that he couldn't lie down to get to sleep. Rotational movements are bothersome (golf, tennis, etc.). Curiously, walking barefoot also seems to make it worse. However, going out on a walk in shoes actually makes it better. A few days ago he was feeling uncomfortable and went out for a 3.5 mi walk and returned home "a new man." He was seen by his PCP four days ago for the same and was given a medrol dosepak and some flexeril, this has been quite helpful.   Of note, had an MRI in 08/23 with L4-L5 mild bilateral foraminal narrowing.   Past Medical History:  Diagnosis Date   Colon polyps    History of colonic polyps 08/13/2004   Qualifier: Diagnosis of  By: Leone Payor MD, Charlyne Quale    Hypertension    Low testosterone    Wears contact lenses     Current Outpatient Medications on File Prior to Visit  Medication Sig Dispense Refill   candesartan (ATACAND) 32 MG tablet Take 1 tablet (32 mg total) by mouth daily. 90 tablet 1   cyclobenzaprine (FLEXERIL) 10 MG tablet Take 1 tablet (10 mg total) by mouth at bedtime. 30 tablet 0   EPINEPHrine (EPIPEN 2-PAK) 0.3 mg/0.3 mL IJ SOAJ injection Inject 0.3 mg into the muscle as needed for anaphylaxis. 2 each 1   Evolocumab (REPATHA SURECLICK) 140 MG/ML SOAJ Inject 140 mg into the skin every 14 (fourteen) days. 2 mL 11   methylPREDNISolone (MEDROL DOSEPAK) 4 MG TBPK tablet Take as directed 21 tablet 0   rosuvastatin (CRESTOR) 40 MG tablet Take 1 tablet (40 mg total) by mouth daily. 90 tablet 3   tadalafil (CIALIS) 20 MG tablet Take one tablet by mouth every other day as needed for erectile dysfunction. 10 tablet 5   Testosterone 1.62 % GEL Apply 1 pump topically to each arm daily as directed 225 g 5   Current Facility-Administered Medications on File Prior to Visit  Medication Dose Route  Frequency Provider Last Rate Last Admin   0.9 %  sodium chloride infusion  500 mL Intravenous Continuous Iva Boop, MD        Past Surgical History:  Procedure Laterality Date   COLONOSCOPY     HAMMERTOE RECONSTRUCTION WITH WEIL OSTEOTOMY Right 03/16/2014   Procedure: RIGHT SECOND/THIRD METATARSAL WEIL OSTEOSTOMY AND HAMMERTOE CORRECTION;  Surgeon: Toni Arthurs, MD;  Location: [facility];  Service: Orthopedics;  Laterality: Right;  ANESTHESIA: GENERAL/REGIONAL BLOCK   PROSTATE SURGERY  2010   bx   VARIOCOCELE REPAIR  1985    Allergies  Allergen Reactions   Red Dye Anaphylaxis    There were no vitals taken for this visit.     08/14/2021    3:21 PM  [facility]  Frequency of aerobic exercise (# of days/week) 7  Average time in minutes 60  Frequency of strengthening activities (# of days/week) 3        No data to display              Objective:  Physical Exam:  Gen: NAD, comfortable in exam room  MSK: Lumbar spine without deformity. There is no tenderness to palpation along the spinal column. There is no paraspinal muscle tenderness or spasm. There is mild TTP over the R SI  joint. There is no tenderness to palpation  FROM in all planes. There is 5-/5 strength to hip abduction bilaterally . Strength is 5/5 in other planes of motion about the hip. He does have a Trendelenburg gait, with hip drop to the R side.   + Fortin finger test. - Log Roll. - SLR. - FABER/FADIR.    Assessment & Plan:  1. Right-Sided Back Pain  Posterior Hip Pain Differential includes SI dysfunction versus radicular symptoms related to his known foraminal narrowing and affecting his gluteus medius function. At this point, his symptoms are much improved with the steroids, which is not helpful in parsing the etiology of his symptoms but is promising that this may be a short-lived illness course. Will plan to complete steroid dose-pak and focus on rehab  exercises to strengthen the glute medius.  Do not see any indication to repeat MRI or pursue any further imaging at this time.  - Strengthening exercises provided - Complete steroid course tomorrow - Okay to continue PRN flexeril  - Return to activity as tolerated - Follow-up if no improvement   J Dorothyann Gibbs, MD

## 2022-07-08 ENCOUNTER — Encounter: Payer: Self-pay | Admitting: Family Medicine

## 2022-07-21 ENCOUNTER — Encounter: Payer: Self-pay | Admitting: Family Medicine

## 2022-07-21 ENCOUNTER — Ambulatory Visit (INDEPENDENT_AMBULATORY_CARE_PROVIDER_SITE_OTHER): Payer: Medicare Other | Admitting: Family Medicine

## 2022-07-21 VITALS — BP 126/70 | HR 67 | Temp 98.0°F | Ht 69.0 in | Wt 182.2 lb

## 2022-07-21 DIAGNOSIS — R972 Elevated prostate specific antigen [PSA]: Secondary | ICD-10-CM | POA: Diagnosis not present

## 2022-07-21 DIAGNOSIS — Z125 Encounter for screening for malignant neoplasm of prostate: Secondary | ICD-10-CM | POA: Diagnosis not present

## 2022-07-21 DIAGNOSIS — R300 Dysuria: Secondary | ICD-10-CM | POA: Diagnosis not present

## 2022-07-21 LAB — PSA, MEDICARE: PSA: 4.84 ng/ml — ABNORMAL HIGH (ref 0.10–4.00)

## 2022-07-21 NOTE — Progress Notes (Signed)
Established Patient Office Visit  Subjective   Patient ID: Shane Perkins, male    DOB: May 10, 1950  Age: 72 y.o. MRN: [id number]  Chief Complaint  Patient presents with   Medical Management of Chronic Issues    HPI   Elijah Birk has history of hypertension, osteoarthritis especially involving knees, chronic elevated PSA, low testosterone, and history of elevated LP(a).  Seen today with some urine frequency intermittently especially at night and occasional burning with urination.  Has been followed by urology in the past.  At 1 point was getting assessment of postvoid residuals but apparently no major obstructions noted.  He would like to consider follow-up PSA.  His PSA in November was slightly elevated at 5.64 compared with previous a year ago 4.01.  However, his PSAs have fluctuated between 4 and 7 over the past 6 years.  No known history of UTI.  No recent fevers or chills.  He had some recent right-sided posterior hip pain initially felt to be SI joint related.  Was placed on some steroids and did eventually improve and symptoms have virtually resolved at this time.  No flank pain.  Past Medical History:  Diagnosis Date   Colon polyps    History of colonic polyps 08/13/2004   Qualifier: Diagnosis of  By: Leone Payor MD, Alfonse Ras E    Hypertension    Low testosterone    Wears contact lenses    Past Surgical History:  Procedure Laterality Date   COLONOSCOPY     HAMMERTOE RECONSTRUCTION WITH WEIL OSTEOTOMY Right 03/16/2014   Procedure: RIGHT SECOND/THIRD METATARSAL WEIL OSTEOSTOMY AND HAMMERTOE CORRECTION;  Surgeon: Toni Arthurs, MD;  [facility];  Service: Orthopedics;  Laterality: Right;  ANESTHESIA: GENERAL/REGIONAL BLOCK   PROSTATE SURGERY  2010   bx   VARIOCOCELE REPAIR  1985    reports that he has never smoked. He has never used smokeless tobacco. He reports current alcohol use. He reports that he does not use drugs. family history includes Colon cancer in  his paternal uncle; Colon polyps in his brother; Hypertension in his mother. Allergies  Allergen Reactions   Red Dye Anaphylaxis    Review of Systems  Constitutional:  Negative for chills and fever.  Genitourinary:  Positive for dysuria and frequency. Negative for flank pain and hematuria.      Objective:     BP 126/70 (BP Location: Left Arm, Patient Position: Sitting, Cuff Size: Normal)   Pulse 67   Temp 98 F (36.7 C) (Oral)   Ht 5\' 9"  (1.753 m)   Wt 182 lb 3.2 oz (82.6 kg)   SpO2 99%   BMI 26.91 kg/m  BP Readings from Last 3 Encounters:  07/21/22 126/70  07/07/22 116/70  07/03/22 (!) 150/90   Wt Readings from Last 3 Encounters:  07/21/22 182 lb 3.2 oz (82.6 kg)  07/03/22 185 lb (83.9 kg)  12/10/21 187 lb 6.4 oz (85 kg)      Physical Exam Vitals reviewed.  Constitutional:      Appearance: Normal appearance.  Cardiovascular:     Rate and Rhythm: Normal rate and regular rhythm.  Pulmonary:     Effort: Pulmonary effort is normal.     Breath sounds: Normal breath sounds.  Neurological:     Mental Status: He is alert.      No results found for any visits on 07/21/22.  Last lipids Lab Results  Component Value Date   CHOL 100 12/10/2021   HDL 51.90 12/10/2021  LDLCALC 30 12/10/2021   TRIG 91.0 12/10/2021   CHOLHDL 2 12/10/2021      The ASCVD Risk score (Arnett DK, et al., 2019) failed to calculate for the following reasons:   The valid total cholesterol range is 130 to 320 mg/dL    Assessment & Plan:   Problem List Items Addressed This Visit   None Visit Diagnoses     Dysuria    -  Primary   Relevant Orders   Urinalysis with Culture Reflex   Elevated PSA       Relevant Orders   PSA, Medicare   Prostate cancer screening       Relevant Orders   PSA, Medicare     History of chronic elevated PSA with prior MRI back in 2017 with prior negative prostate biopsies.  Recheck PSA today.  Recent urine frequency and mild intermittent dysuria.   Rule out UTI.  Urinalysis with reflex culture ordered  No follow-ups on file.    Evelena Peat, MD

## 2022-07-22 LAB — URINALYSIS W MICROSCOPIC + REFLEX CULTURE
Bacteria, UA: NONE SEEN /HPF
Bilirubin Urine: NEGATIVE
Glucose, UA: NEGATIVE
Hgb urine dipstick: NEGATIVE
Hyaline Cast: NONE SEEN /LPF
Ketones, ur: NEGATIVE
Leukocyte Esterase: NEGATIVE
Nitrites, Initial: NEGATIVE
Protein, ur: NEGATIVE
Specific Gravity, Urine: 1.012 (ref 1.001–1.035)
Squamous Epithelial / HPF: NONE SEEN /HPF (ref <=5)
WBC, UA: NONE SEEN /HPF (ref 0–5)
pH: 7 (ref 5.0–8.0)

## 2022-07-22 LAB — NO CULTURE INDICATED

## 2022-08-06 ENCOUNTER — Ambulatory Visit (INDEPENDENT_AMBULATORY_CARE_PROVIDER_SITE_OTHER): Payer: Medicare Other | Admitting: Family Medicine

## 2022-08-06 VITALS — BP 134/82 | Ht 70.0 in | Wt 180.0 lb

## 2022-08-06 DIAGNOSIS — M25551 Pain in right hip: Secondary | ICD-10-CM | POA: Diagnosis not present

## 2022-08-07 ENCOUNTER — Encounter: Payer: Self-pay | Admitting: Family Medicine

## 2022-08-07 NOTE — Progress Notes (Signed)
PCP: Kristian Covey, MD  Subjective:   HPI: Patient is a 72 y.o. male here for right hip pain.  6/24: Patient is a 72 y.o. male here for right-sided posterior hip pain acutely worse over the past week or so, to the point that he couldn't lie down to get to sleep. Rotational movements are bothersome (golf, tennis, etc.). Curiously, walking barefoot also seems to make it worse. However, going out on a walk in shoes actually makes it better. A few days ago he was feeling uncomfortable and went out for a 3.5 mi walk and returned home "a new man." He was seen by his PCP four days ago for the same and was given a medrol dosepak and some flexeril, this has been quite helpful.   Of note, had an MRI in 08/23 with L4-L5 mild bilateral foraminal narrowing.   7/24: Patient reports while he has improved, he still noticed right hip pain daily. Worse when standing on concrete as opposed to other surfaces, asphalt. Worse when he golfs 18 holes, pickleball. No radiation past knee.  Past Medical History:  Diagnosis Date   Colon polyps    History of colonic polyps 08/13/2004   Qualifier: Diagnosis of  By: Leone Payor MD, Charlyne Quale    Hypertension    Low testosterone    Wears contact lenses     Current Outpatient Medications on File Prior to Visit  Medication Sig Dispense Refill   candesartan (ATACAND) 32 MG tablet Take 1 tablet (32 mg total) by mouth daily. 90 tablet 1   EPINEPHrine (EPIPEN 2-PAK) 0.3 mg/0.3 mL IJ SOAJ injection Inject 0.3 mg into the muscle as needed for anaphylaxis. 2 each 1   Evolocumab (REPATHA SURECLICK) 140 MG/ML SOAJ Inject 140 mg into the skin every 14 (fourteen) days. 2 mL 11   rosuvastatin (CRESTOR) 40 MG tablet Take 1 tablet (40 mg total) by mouth daily. 90 tablet 3   tadalafil (CIALIS) 20 MG tablet Take one tablet by mouth every other day as needed for erectile dysfunction. 10 tablet 5   Testosterone 1.62 % GEL Apply 1 pump topically to each arm daily as directed 225 g  5   Current Facility-Administered Medications on File Prior to Visit  Medication Dose Route Frequency Provider Last Rate Last Admin   0.9 %  sodium chloride infusion  500 mL Intravenous Continuous Iva Boop, MD        Past Surgical History:  Procedure Laterality Date   COLONOSCOPY     HAMMERTOE RECONSTRUCTION WITH WEIL OSTEOTOMY Right 03/16/2014   Procedure: RIGHT SECOND/THIRD METATARSAL WEIL OSTEOSTOMY AND HAMMERTOE CORRECTION;  Surgeon: Toni Arthurs, MD;  Location: [facility];  Service: Orthopedics;  Laterality: Right;  ANESTHESIA: GENERAL/REGIONAL BLOCK   PROSTATE SURGERY  2010   bx   VARIOCOCELE REPAIR  1985    Allergies  Allergen Reactions   Red Dye Anaphylaxis    BP 134/82   Ht 5\' 10"  (1.778 m)   Wt 180 lb (81.6 kg)   BMI 25.83 kg/m      08/14/2021    3:21 PM  [facility]  Frequency of aerobic exercise (# of days/week) 7  Average time in minutes 60  Frequency of strengthening activities (# of days/week) 3        No data to display              Objective:  Physical Exam:  Gen: NAD, comfortable in exam room  Back/right hip:  No gross deformity, scoliosis. No TTP currently.  No midline or bony TTP. FROM. Strength LEs 5/5 all muscle groups.   Negative SLRs. Sensation intact to light touch bilaterally. Negative logroll, faber, fadir, SI compression.   Assessment & Plan:  1. Right hip pain - overall improved though with pain on prolonged standing especially on concrete.  Exam is reassuring also.  Pain more medial than the SI joint.  Consistent with facet arthropathy seen on MRI.  He will continue with home exercises, stretches.  Consider formal physical therapy in future.  Otc meds as needed.

## 2022-08-15 ENCOUNTER — Encounter: Payer: Self-pay | Admitting: Family Medicine

## 2022-08-15 MED ORDER — TESTOSTERONE 1.62 % TD GEL: 1.0000 | Freq: Every day | TRANSDERMAL | 5 refills | Status: DC

## 2022-08-15 NOTE — Telephone Encounter (Signed)
Refill sent to [facility].    Kristian Covey MD  Primary Care at [facility]

## 2022-10-16 ENCOUNTER — Telehealth: Payer: Self-pay | Admitting: Internal Medicine

## 2022-10-16 DIAGNOSIS — Z860100 Personal history of colon polyps, unspecified: Secondary | ICD-10-CM

## 2022-10-16 NOTE — Telephone Encounter (Signed)
Please contact Dr. Riley Kill and arrange direct colonoscopy for December 2024  Encounter Diagnosis  Name Primary?   History of colonic polyps Yes

## 2022-10-17 ENCOUNTER — Ambulatory Visit (HOSPITAL_COMMUNITY)
Admission: RE | Admit: 2022-10-17 | Discharge: 2022-10-17 | Disposition: A | Discharge status: Home / Self Care | Payer: Medicare Other | Source: Ambulatory Visit | Attending: Internal Medicine

## 2022-10-17 ENCOUNTER — Encounter: Payer: Self-pay | Admitting: Nurse Practitioner

## 2022-10-17 ENCOUNTER — Telehealth: Payer: Medicare Other | Admitting: Nurse Practitioner

## 2022-10-17 ENCOUNTER — Other Ambulatory Visit (HOSPITAL_COMMUNITY): Payer: Self-pay | Admitting: *Deleted

## 2022-10-17 DIAGNOSIS — K625 Hemorrhage of anus and rectum: Secondary | ICD-10-CM

## 2022-10-17 DIAGNOSIS — T148XXA Other injury of unspecified body region, initial encounter: Secondary | ICD-10-CM

## 2022-10-17 LAB — PROTIME-INR
INR: 1 (ref 0.8–1.2)
Prothrombin Time: 13.7 s (ref 11.4–15.2)

## 2022-10-17 LAB — CBC
HCT: 40.7 % (ref 39.0–52.0)
Hemoglobin: 14.3 g/dL (ref 13.0–17.0)
MCH: 32.4 pg (ref 26.0–34.0)
MCHC: 35.1 g/dL (ref 30.0–36.0)
MCV: 92.3 fL (ref 80.0–100.0)
Platelets: 176 10*3/uL (ref 150–400)
RBC: 4.41 MIL/uL (ref 4.22–5.81)
RDW: 12.2 % (ref 11.5–15.5)
WBC: 4.7 10*3/uL (ref 4.0–10.5)
nRBC: 0 % (ref 0.0–0.2)

## 2022-10-17 NOTE — Telephone Encounter (Signed)
I left Dr Riley Kill a detailed message to please call us back and set up his colonoscopy for December 2024.

## 2022-10-17 NOTE — Progress Notes (Signed)
Virtual Visit Consent   Shane Perkins, you are scheduled for a virtual visit with a [facility] provider today. Just as with appointments in the office, your consent must be obtained to participate. Your consent will be active for this visit and any virtual visit you may have with one of our providers in the next 365 days. If you have a MyChart account, a copy of this consent can be sent to you electronically.  As this is a virtual visit, video technology does not allow for your provider to perform a traditional examination. This may limit your provider's ability to fully assess your condition. If your provider identifies any concerns that need to be evaluated in person or the need to arrange testing (such as labs, EKG, etc.), we will make arrangements to do so. Although advances in technology are sophisticated, we cannot ensure that it will always work on either your end or our end. If the connection with a video visit is poor, the visit may have to be switched to a telephone visit. With either a video or telephone visit, we are not always able to ensure that we have a secure connection.  By engaging in this virtual visit, you consent to the provision of healthcare and authorize for your insurance to be billed (if applicable) for the services provided during this visit. Depending on your insurance coverage, you may receive a charge related to this service.  I need to obtain your verbal consent now. Are you willing to proceed with your visit today? EDOARDO ERSTAD has provided verbal consent on 10/17/2022 for a virtual visit (video or telephone). Viviano Simas, FNP  Date: 10/17/2022 1:33 PM  Virtual Visit via Video Note   I, Viviano Simas, connected with  Shane Perkins  ([id number], 06-Oct-1950) on 10/17/22 at  1:45 PM EDT by a video-enabled telemedicine application and verified that I am speaking with the correct person using two identifiers.  Location: Patient: Virtual Visit Location Patient:  Home Provider: Virtual Visit Location Provider: Home Office   I discussed the limitations of evaluation and management by telemedicine and the availability of in person appointments. The patient expressed understanding and agreed to proceed.    History of Present Illness: KMARI Perkins is a 72 y.o. who identifies as a male who was assigned male at birth, and is being seen today for two episode of spontaneous bruising.hematoma formation  Once on his finger when golfing  Once after walking in his large toe   He takes 81mg  ASA daily  He also uses topical testosterone Last CBC was 11/2021 with normal platelet count at 229    Problems:  Patient Active Problem List   Diagnosis Date Noted   Osteoarthritis of right knee 08/23/2020   Low testosterone 06/02/2012   History of elevated PSA 06/02/2012   PERSONAL HISTORY OF OTHER SPECIFIED DISEASES 04/27/2009   HYPERTENSION 09/10/2007   HEMORRHOIDS, INTERNAL 09/10/2007   RECTAL BLEEDING 09/10/2007   HIATAL HERNIA 08/13/2004   History of colonic polyps 08/13/2004    Allergies:  Allergies  Allergen Reactions   Red Dye #40 (Allura Red) Anaphylaxis   Medications:  Current Outpatient Medications:    candesartan (ATACAND) 32 MG tablet, Take 1 tablet (32 mg total) by mouth daily., Disp: 90 tablet, Rfl: 1   EPINEPHrine (EPIPEN 2-PAK) 0.3 mg/0.3 mL IJ SOAJ injection, Inject 0.3 mg into the muscle as needed for anaphylaxis., Disp: 2 each, Rfl: 1   Evolocumab (REPATHA SURECLICK) 140 MG/ML SOAJ, Inject 140  mg into the skin every 14 (fourteen) days., Disp: 2 mL, Rfl: 11   rosuvastatin (CRESTOR) 40 MG tablet, Take 1 tablet (40 mg total) by mouth daily., Disp: 90 tablet, Rfl: 3   tadalafil (CIALIS) 20 MG tablet, Take one tablet by mouth every other day as needed for erectile dysfunction., Disp: 10 tablet, Rfl: 5   Testosterone 1.62 % GEL, Apply 1 pump topically to each arm daily as directed, Disp: 225 g, Rfl: 5  Observations/Objective: Patient is  well-developed, well-nourished in no acute distress.  Resting comfortably  at home.  Head is normocephalic, atraumatic.  No labored breathing.  Speech is clear and coherent with logical content.  Patient is alert and oriented at baseline.    Assessment and Plan:  1. Hematoma Advised calling PCP to order CBC- will copy note  At this time [facility] is unable to order labs  Patient is agreeable to plan     Follow Up Instructions: I discussed the assessment and treatment plan with the patient. The patient was provided an opportunity to ask questions and all were answered. The patient agreed with the plan and demonstrated an understanding of the instructions.  A copy of instructions were sent to the patient via MyChart unless otherwise noted below.    The patient was advised to call back or seek an in-person evaluation if the symptoms worsen or if the condition fails to improve as anticipated.  Time:  I spent 2 minutes with the patient via telehealth technology discussing the above problems/concerns.    Viviano Simas, FNP

## 2022-10-22 ENCOUNTER — Other Ambulatory Visit: Payer: Self-pay | Admitting: Family Medicine

## 2022-10-30 DIAGNOSIS — N529 Male erectile dysfunction, unspecified: Secondary | ICD-10-CM | POA: Diagnosis not present

## 2022-10-30 DIAGNOSIS — R972 Elevated prostate specific antigen [PSA]: Secondary | ICD-10-CM | POA: Diagnosis not present

## 2022-10-30 DIAGNOSIS — N138 Other obstructive and reflux uropathy: Secondary | ICD-10-CM | POA: Diagnosis not present

## 2022-10-30 DIAGNOSIS — E291 Testicular hypofunction: Secondary | ICD-10-CM | POA: Diagnosis not present

## 2022-10-30 DIAGNOSIS — N401 Enlarged prostate with lower urinary tract symptoms: Secondary | ICD-10-CM | POA: Diagnosis not present

## 2022-11-03 ENCOUNTER — Other Ambulatory Visit (HOSPITAL_COMMUNITY): Payer: Self-pay

## 2022-12-03 ENCOUNTER — Ambulatory Visit: Payer: Medicare Other | Admitting: Family Medicine

## 2022-12-03 ENCOUNTER — Encounter: Payer: Self-pay | Admitting: Internal Medicine

## 2022-12-05 DIAGNOSIS — H11432 Conjunctival hyperemia, left eye: Secondary | ICD-10-CM | POA: Diagnosis not present

## 2022-12-16 ENCOUNTER — Ambulatory Visit: Payer: Medicare Other | Admitting: Family Medicine

## 2022-12-16 ENCOUNTER — Encounter: Payer: Self-pay | Admitting: Family Medicine

## 2022-12-16 VITALS — BP 136/90 | HR 73 | Temp 97.7°F | Ht 69.29 in | Wt 185.0 lb

## 2022-12-16 DIAGNOSIS — E538 Deficiency of other specified B group vitamins: Secondary | ICD-10-CM | POA: Diagnosis not present

## 2022-12-16 DIAGNOSIS — R7309 Other abnormal glucose: Secondary | ICD-10-CM

## 2022-12-16 DIAGNOSIS — E785 Hyperlipidemia, unspecified: Secondary | ICD-10-CM

## 2022-12-16 DIAGNOSIS — I10 Essential (primary) hypertension: Secondary | ICD-10-CM

## 2022-12-16 DIAGNOSIS — Z0000 Encounter for general adult medical examination without abnormal findings: Secondary | ICD-10-CM | POA: Diagnosis not present

## 2022-12-16 MED ORDER — LOSARTAN POTASSIUM 100 MG PO TABS: 100.0000 mg | ORAL_TABLET | Freq: Every day | ORAL | 3 refills | Status: DC

## 2022-12-16 NOTE — Patient Instructions (Signed)
Set up repeat Colonoscopy  Set up labs for next week.

## 2022-12-16 NOTE — Progress Notes (Addendum)
Established Patient Office Visit  Subjective   Patient ID: Shane Perkins, male    DOB: Sep 03, 1950  Age: 72 y.o. MRN: [id number]  Chief Complaint  Patient presents with   Annual Exam    HPI   Shane Perkins is seen today for physical exam/well visit- and for medical follow up.Marland Kitchen  He has history of low testosterone, elevated PSA followed by urology, osteoarthritis, hypertension.  History of coronary CTA 2022 with coronary calcium score of 405 with mild nonobstructive CAD proximal LAD, minimal diffuse plaque RCA and left circumflex arteries, and moderate plaque in the ostial portion of small first diagonal artery.  He is on aggressive antilipid therapy with Repatha and also takes Crestor 20 mg every other day.  Hypertension currently treated with candesartan.  Requesting change to losartan 100 mg daily for insurance purposes.  Remains on topical testosterone gel.  PSA back in July stable 4.84 which was actually down from a year ago.  Recent CBC back in October stable with hemoglobin 14.3 and hematocrit of 40.7.  Generally doing well.  Very diligent with exercise.  Usually does 6 to 8 miles per day between walking and elliptical.  Also doing a lot of core exercises recently.  Health maintenance reviewed:  -Due for repeat colonoscopy and he will set that up soon -Had 1 Shingrix vaccine but never got a second -Flu vaccine already given -Tetanus due 2025 -Pneumonia vaccines complete -No history of RSV vaccine yet  Social History   Socioeconomic History   Marital status: Married    Spouse name: Not on file   Number of children: Not on file   Years of education: Not on file   Highest education level: Professional school degree (e.g., MD, DDS, DVM, JD)  Occupational History   Not on file  Tobacco Use   Smoking status: Never   Smokeless tobacco: Never  Vaping Use   Vaping status: Never Used  Substance and Sexual Activity   Alcohol use: Yes    Comment: occ. 2 glasses wine month per pt    Drug use: No   Sexual activity: Not on file  Other Topics Concern   Not on file  Social History Narrative   Not on file   Social Determinants of Health   Financial Resource Strain: Low Risk  (12/15/2022)   Overall Financial Resource Strain (CARDIA)    Difficulty of Paying Living Expenses: Not hard at all  Food Insecurity: No Food Insecurity (12/15/2022)   Hunger Vital Sign    Worried About Running Out of Food in the Last Year: Never true    Ran Out of Food in the Last Year: Never true  Transportation Needs: No Transportation Needs (12/15/2022)   PRAPARE - Administrator, Civil Service (Medical): No    Lack of Transportation (Non-Medical): No  Physical Activity: Sufficiently Active (12/15/2022)   Exercise Vital Sign    Days of Exercise per Week: 7 days    Minutes of Exercise per Session: 90 min  Stress: No Stress Concern Present (12/15/2022)   [employer] of Occupational Health - Occupational Stress Questionnaire    Feeling of Stress : Not at all  Social Connections: Socially Integrated (12/15/2022)   Social Connection and Isolation Panel [NHANES]    Frequency of Communication with Friends and Family: More than three times a week    Frequency of Social Gatherings with Friends and Family: Twice a week    Attends Religious Services: More than 4 times per year  Active Member of Clubs or Organizations: Yes    Attends Banker Meetings: More than 4 times per year    Marital Status: Married  Catering manager Violence: Not on file   Family History  Problem Relation Age of Onset   Hypertension Mother    Colon polyps Brother    Colon cancer Paternal Uncle        possible.  not sure   Esophageal cancer Neg Hx    Pancreatic cancer Neg Hx    Prostate cancer Neg Hx    Rectal cancer Neg Hx    Stomach cancer Neg Hx       Review of Systems  Constitutional:  Negative for malaise/fatigue and weight loss.  Eyes:  Negative for blurred vision.  Respiratory:   Negative for shortness of breath.   Cardiovascular:  Negative for chest pain.  Gastrointestinal:  Negative for abdominal pain.  Neurological:  Negative for dizziness, weakness and headaches.      Objective:     BP (!) 136/90   Pulse 73   Temp 97.7 F (36.5 C) (Oral)   Ht 5' 9.29" (1.76 m)   Wt 185 lb (83.9 kg)   SpO2 98%   BMI 27.09 kg/m  BP Readings from Last 3 Encounters:  12/16/22 (!) 136/90  08/06/22 134/82  07/21/22 126/70   Wt Readings from Last 3 Encounters:  12/16/22 185 lb (83.9 kg)  08/06/22 180 lb (81.6 kg)  07/21/22 182 lb 3.2 oz (82.6 kg)      Physical Exam Vitals reviewed.  Constitutional:      General: He is not in acute distress.    Appearance: He is well-developed. He is not ill-appearing.  HENT:     Mouth/Throat:     Mouth: Mucous membranes are moist.     Pharynx: Oropharynx is clear.  Neck:     Thyroid: No thyromegaly.  Cardiovascular:     Rate and Rhythm: Normal rate and regular rhythm.  Pulmonary:     Effort: Pulmonary effort is normal. No respiratory distress.     Breath sounds: Normal breath sounds. No wheezing or rales.  Musculoskeletal:     Cervical back: Neck supple.     Right lower leg: No edema.     Left lower leg: No edema.  Skin:    Comments: Very small superficial cut approximately 1/2 cm in length distal lateral aspect of left foot.  No evidence for secondary infection.  No visible foreign body noted.  No foreign body palpated.  Neurological:     Mental Status: He is alert and oriented to person, place, and time.      No results found for any visits on 12/16/22.    The ASCVD Risk score (Arnett DK, et al., 2019) failed to calculate for the following reasons:   The valid total cholesterol range is 130 to 320 mg/dL    Assessment & Plan:   #1  Physical exam.  Chronic medical problems including hypertension (which is generally well-controlled by home readings), low testosterone on topical replacement, dyslipidemia with  elevated coronary calcium score.  Discussed the following health maintenance items:  -Flu vaccine already given -Set up repeat colonoscopy -Switch candesartan to losartan 100 mg daily for insurance reasons.  Continue close monitoring of blood pressure -Continue regular exercise habits. -Check future labs with lipid, CMP, B12 (past hx of low normal B12). -Consider reinitiating Shingrix vaccine at some point  #2 Hypertension.  Stable.  Changing Candesartan to losartan as above for  insurance reasons.  Continue close monitoring  #3 history of low normal B12.  Recheck B12 level with future labs  #4 hyperlipidemia with history of high coronary calcium score.  On Repatha and rosuvastatin.  Future lab order placed for lipid and CMP  #5 history of low testosterone.  On topical replacement.  Recent CBC stable.  PSA followed per urology and last July stable.  No follow-ups on file.    Evelena Peat, MD

## 2022-12-25 ENCOUNTER — Encounter: Payer: Self-pay | Admitting: Internal Medicine

## 2022-12-25 MED ORDER — NA SULFATE-K SULFATE-MG SULF 17.5-3.13-1.6 GM/177ML PO SOLN: 1.0000 | Freq: Once | ORAL | 0 refills | Status: AC

## 2022-12-25 NOTE — Addendum Note (Signed)
Addended by: [place], Brandie Lopes E on: 12/25/2022 05:31 PM   Modules accepted: Orders

## 2022-12-25 NOTE — Telephone Encounter (Signed)
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 Confirmed his email and will email the suprep instructions to him tonight. We do not have any pre-visit appointments open until January. He is not diabetic and no BT's.

## 2022-12-31 ENCOUNTER — Encounter: Payer: Self-pay | Admitting: Certified Registered Nurse Anesthetist

## 2022-12-31 NOTE — Progress Notes (Unsigned)
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 Here for surveillance colonoscopy.   Past Medical History:  Diagnosis Date   Colon polyps    History of colonic polyps 08/13/2004   Qualifier: Diagnosis of  By: Leone Payor MD, Alfonse Ras E    Hypertension    Low testosterone    Wears contact lenses     Past Surgical History:  Procedure Laterality Date   COLONOSCOPY     HAMMERTOE RECONSTRUCTION WITH WEIL OSTEOTOMY Right 03/16/2014   Procedure: RIGHT SECOND/THIRD METATARSAL WEIL OSTEOSTOMY AND HAMMERTOE CORRECTION;  Surgeon: Toni Arthurs, MD;  Location:  [facility];  Service: Orthopedics;  Laterality: Right;  ANESTHESIA: GENERAL/REGIONAL BLOCK   PROSTATE SURGERY  2010   bx   VARIOCOCELE REPAIR  1985    Prior to Admission medications   Medication Sig Start Date End Date Taking? Authorizing Provider  EPINEPHrine (EPIPEN 2-PAK) 0.3 mg/0.3 mL IJ SOAJ injection Inject 0.3 mg into the muscle as needed for anaphylaxis. 02/13/22   Burchette, Elberta Fortis, MD  Evolocumab (REPATHA SURECLICK) 140 MG/ML SOAJ Inject 140 mg into the skin every 14 (fourteen) days. 02/13/22   Burchette, Elberta Fortis, MD  losartan (COZAAR) 100 MG tablet Take 1 tablet (100 mg total) by mouth daily. 12/16/22   Burchette, Elberta Fortis, MD  rosuvastatin (CRESTOR) 40 MG tablet Take 1 tablet (40 mg total) by mouth daily. 02/13/22   Burchette, Elberta Fortis, MD  tadalafil (CIALIS) 20 MG tablet Take one tablet by mouth every other day as needed for erectile dysfunction. 02/13/22   Burchette, Elberta Fortis, MD  Testosterone 1.62 % GEL Apply 1 pump topically to each arm daily as directed 08/15/22   Kristian Covey, MD    Current Outpatient Medications  Medication Sig Dispense Refill   aspirin 81 MG chewable tablet Chew 81 mg by mouth daily.      Evolocumab (REPATHA SURECLICK) 140 MG/ML SOAJ Inject 140 mg into the skin every 14 (fourteen) days. 2 mL 11   losartan (COZAAR) 100 MG tablet Take 1 tablet (100 mg total) by mouth daily. 90 tablet 3   tadalafil (CIALIS) 20 MG tablet Take one tablet by mouth every other day as needed for erectile dysfunction. 10 tablet 5   Testosterone 1.62 % GEL Apply 1 pump topically to each arm daily as directed 225 g 5   EPINEPHrine (EPIPEN 2-PAK) 0.3 mg/0.3 mL IJ SOAJ injection Inject 0.3 mg into the muscle as needed for anaphylaxis. 2 each 1   rosuvastatin (CRESTOR) 40 MG tablet Take 1 tablet (40 mg total) by mouth daily. (Patient taking differently: Take 20 mg by mouth every other day.) 90 tablet 3   Current Facility-Administered Medications  Medication Dose Route Frequency Provider Last Rate Last Admin   0.9 %  sodium chloride infusion  500 mL Intravenous Continuous Iva Boop, MD        Allergies as of 01/01/2023 - Review Complete 01/01/2023  Allergen Reaction Noted   Red dye #40 (allura red) Anaphylaxis 08/08/2019    Family History  Problem Relation Age of Onset   Hypertension Mother    Colon polyps Brother    Colon cancer Paternal Uncle        possible.  not sure  Esophageal cancer Neg Hx    Pancreatic cancer Neg Hx    Prostate cancer Neg Hx    Rectal cancer Neg Hx    Stomach cancer Neg Hx     Social History   Socioeconomic History   Marital status: Married    Spouse name: Not on file   Number of children: 3   Years of education: Not on file   Highest education level: Professional school degree (e.g., MD, DDS, DVM, JD)  Occupational History   Not on file  Tobacco Use   Smoking status: Never   Smokeless tobacco: Never  Vaping Use   Vaping status: Never Used  Substance and Sexual Activity   Alcohol use: Yes    Comment: occ. 2 glasses wine month per pt   Drug use: No   Sexual activity: Not on file  Other Topics Concern   Not on file  Social History Narrative   Not  on file   Social Drivers of Health   Financial Resource Strain: Low Risk  (12/15/2022)   Overall Financial Resource Strain (CARDIA)    Difficulty of Paying Living Expenses: Not hard at all  Food Insecurity: No Food Insecurity (12/15/2022)   Hunger Vital Sign    Worried About Running Out of Food in the Last Year: Never true    Ran Out of Food in the Last Year: Never true  Transportation Needs: No Transportation Needs (12/15/2022)   PRAPARE - Administrator, Civil Service (Medical): No    Lack of Transportation (Non-Medical): No  Physical Activity: Sufficiently Active (12/15/2022)   Exercise Vital Sign    Days of Exercise per Week: 7 days    Minutes of Exercise per Session: 90 min  Stress: No Stress Concern Present (12/15/2022)   [employer] of Occupational Health - Occupational Stress Questionnaire    Feeling of Stress : Not at all  Social Connections: Socially Integrated (12/15/2022)   Social Connection and Isolation Panel [NHANES]    Frequency of Communication with Friends and Family: More than three times a week    Frequency of Social Gatherings with Friends and Family: Twice a week    Attends Religious Services: More than 4 times per year    Active Member of [employer] or Organizations: Yes    Attends Engineer, structural: More than 4 times per year    Marital Status: Married  Catering manager Violence: Not on file    Review of Systems:  All other review of systems negative except as mentioned in the HPI.  Physical Exam: Vital signs BP (!) 146/89   Pulse 69   Temp (!) 97.3 F (36.3 C) (Skin)   Ht 5\' 10"  (1.778 m)   Wt 180 lb 9.6 oz (81.9 kg)   SpO2 95%   BMI 25.91 kg/m   General:   Alert,  Well-developed, well-nourished, pleasant and cooperative in NAD Lungs:  Clear throughout to auscultation.   Heart:  Regular rate and rhythm; no murmurs, clicks, rubs,  or gallops. Abdomen:  Soft, nontender and nondistended. Normal bowel sounds.   Neuro/Psych:   Alert and cooperative. Normal mood and affect. A and O x 3   @Ruie Sendejo  Sena Slate, MD, [facility] [phone] (pager) 01/01/2023 9:43 AM@

## 2023-01-01 ENCOUNTER — Encounter: Payer: Self-pay | Admitting: Internal Medicine

## 2023-01-01 ENCOUNTER — Ambulatory Visit: Payer: Medicare Other | Admitting: Internal Medicine

## 2023-01-01 VITALS — BP 118/79 | HR 72 | Temp 97.3°F | Resp 16 | Ht 70.0 in | Wt 180.6 lb

## 2023-01-01 DIAGNOSIS — Z860100 Personal history of colon polyps, unspecified: Secondary | ICD-10-CM

## 2023-01-01 DIAGNOSIS — Z1211 Encounter for screening for malignant neoplasm of colon: Secondary | ICD-10-CM

## 2023-01-01 DIAGNOSIS — Z860101 Personal history of adenomatous and serrated colon polyps: Secondary | ICD-10-CM

## 2023-01-01 DIAGNOSIS — N401 Enlarged prostate with lower urinary tract symptoms: Secondary | ICD-10-CM

## 2023-01-01 DIAGNOSIS — I10 Essential (primary) hypertension: Secondary | ICD-10-CM | POA: Diagnosis not present

## 2023-01-01 MED ORDER — SODIUM CHLORIDE 0.9 % IV SOLN: 500.0000 mL | INTRAVENOUS | Status: DC

## 2023-01-01 NOTE — Progress Notes (Signed)
Report given to PACU, vss 

## 2023-01-01 NOTE — Progress Notes (Signed)
Patient states there have been no changes to medical or surgical history since time of pre-visit. 

## 2023-01-01 NOTE — Op Note (Signed)
[facility] Patient Name: Shane Perkins Procedure Date: 01/01/2023 9:41 AM MRN: [id number] Endoscopist: Iva Boop , MD, [id number] Age: 72 Referring MD:  Date of Birth: 04-27-1950 Gender: Male Account #: [id number] Procedure:                Colonoscopy Indications:              Surveillance: Personal history of adenomatous                            polyps on last colonoscopy > 5 years ago, Last                            colonoscopy: 2017 Medicines:                Monitored Anesthesia Care Procedure:                Pre-Anesthesia Assessment:                           - Prior to the procedure, a History and Physical                            was performed, and patient medications and                            allergies were reviewed. The patient's tolerance of                            previous anesthesia was also reviewed. The risks                            and benefits of the procedure and the sedation                            options and risks were discussed with the patient.                            All questions were answered, and informed consent                            was obtained. Prior Anticoagulants: The patient has                            taken no anticoagulant or antiplatelet agents. ASA                            Grade Assessment: II - A patient with mild systemic                            disease. After reviewing the risks and benefits,                            the patient was deemed in satisfactory condition to  undergo the procedure.                           After obtaining informed consent, the colonoscope                            was passed under direct vision. Throughout the                            procedure, the patient's blood pressure, pulse, and                            oxygen saturations were monitored continuously. The                            Olympus Scope SN: [id number] was introduced  through                            the anus with the intention of advancing to the                            cecum. The scope was advanced to the transverse                            colon before the procedure was aborted. Medications                            were given. The colonoscopy was performed with                            difficulty due to poor endoscopic visualization.                            The patient tolerated the procedure well. The                            quality of the bowel preparation was inadequate.                            The colonoscopy was aborted due to poor endoscopic                            visualization. The bowel preparation used was                            SUPREP via split dose instruction. Scope In: 9:54:30 AM Scope Out: 10:05:22 AM Total Procedure Duration: 0 hours 10 minutes 52 seconds  Findings:                 The digital rectal exam findings include enlarged                            prostate. Pertinent negatives include no palpable  rectal lesions.                           Fibrous material, liquid and semi-solid stool was                            found in the rectum, in the sigmoid colon, in the                            descending colon and in the transverse colon,                            making visualization difficult. Lavage of the area                            was performed, resulting in incomplete clearance                            with continued poor visualization. Complications:            No immediate complications. Estimated Blood Loss:     Estimated blood loss: none. Impression:               - Preparation of the colon was inadequate. There                            was significant fibrous debris and liquid + some                            semi-solid stool - patchy but enough that I did not                            think an adequate exam weas possible so aborted.                            - The procedure was aborted due to poor endoscopic                            visualization.                           - Enlarged prostate found on digital rectal exam.                           - Stool in the rectum, in the sigmoid colon, in the                            descending colon and in the transverse colon.                           - No specimens collected.                           - Personal history of colonic polyps. Recommendation:           -  Patient has a contact number available for                            emergencies. The signs and symptoms of potential                            delayed complications were discussed with the                            patient. Return to normal activities tomorrow.                            Written discharge instructions were provided to the                            patient.                           - Resume previous diet.                           - Continue present medications.                           - RESCHEDULE COLONOSCOPY - BETTER FIBER                            RESTRICTION, DOUBLE PREP                           colon somewhat redundant so would apply abdominal                            binder next time Iva Boop, MD 01/01/2023 10:17:47 AM This report has been signed electronically.

## 2023-01-01 NOTE — Patient Instructions (Addendum)
The prep did not work like we want - I aborted the procedure as I do not think I can inspect adequately.  This happens sometimes, unfortunately.  We will reschedule - need better fiber restriction before and will use a double prep.  I appreciate the opportunity to care for you. Iva Boop, MD, FACG   YOU HAD AN ENDOSCOPIC PROCEDURE TODAY AT THE [facility]:   Refer to the procedure report that was given to you for any specific questions about what was found during the examination.  If the procedure report does not answer your questions, please call your gastroenterologist to clarify.  If you requested that your care partner not be given the details of your procedure findings, then the procedure report has been included in a sealed envelope for you to review at your convenience later.  YOU SHOULD EXPECT: Some feelings of bloating in the abdomen. Passage of more gas than usual.  Walking can help get rid of the air that was put into your GI tract during the procedure and reduce the bloating. If you had a lower endoscopy (such as a colonoscopy or flexible sigmoidoscopy) you may notice spotting of blood in your stool or on the toilet paper. If you underwent a bowel prep for your procedure, you may not have a normal bowel movement for a few days.  Please Note:  You might notice some irritation and congestion in your nose or some drainage.  This is from the oxygen used during your procedure.  There is no need for concern and it should clear up in a day or so.  SYMPTOMS TO REPORT IMMEDIATELY:  Following lower endoscopy (colonoscopy or flexible sigmoidoscopy):  Excessive amounts of blood in the stool  Significant tenderness or worsening of abdominal pains  Swelling of the abdomen that is new, acute  Fever of 100F or higher  For urgent or emergent issues, a gastroenterologist can be reached at any hour by calling [phone]. Do not use MyChart messaging for urgent concerns.     DIET:  We do recommend a small meal at first, but then you may proceed to your regular diet.  Drink plenty of fluids but you should avoid alcoholic beverages for 24 hours.  ACTIVITY:  You should plan to take it easy for the rest of today and you should NOT DRIVE or use heavy machinery until tomorrow (because of the sedation medicines used during the test).    FOLLOW UP: Our staff will call the number listed on your records the next business day following your procedure.  We will call around 7:15- 8:00 am to check on you and address any questions or concerns that you may have regarding the information given to you following your procedure. If we do not reach you, we will leave a message.     If any biopsies were taken you will be contacted by phone or by letter within the next 1-3 weeks.  Please call us at [phone] if you have not heard about the biopsies in 3 weeks.    SIGNATURES/CONFIDENTIALITY: You and/or your care partner have signed paperwork which will be entered into your electronic medical record.  These signatures attest to the fact that that the information above on your After Visit Summary has been reviewed and is understood.  Full responsibility of the confidentiality of this discharge information lies with you and/or your care-partner.

## 2023-01-02 ENCOUNTER — Telehealth: Payer: Self-pay

## 2023-01-02 ENCOUNTER — Other Ambulatory Visit (INDEPENDENT_AMBULATORY_CARE_PROVIDER_SITE_OTHER): Payer: Medicare Other

## 2023-01-02 DIAGNOSIS — E785 Hyperlipidemia, unspecified: Secondary | ICD-10-CM

## 2023-01-02 DIAGNOSIS — E538 Deficiency of other specified B group vitamins: Secondary | ICD-10-CM | POA: Diagnosis not present

## 2023-01-02 LAB — LIPID PANEL
Cholesterol: 94 mg/dL (ref 0–200)
HDL: 45.8 mg/dL (ref >39.00)
LDL Cholesterol: 40 mg/dL (ref 0–99)
NonHDL: 48.13
Total CHOL/HDL Ratio: 2
Triglycerides: 39 mg/dL (ref 0.0–149.0)
VLDL: 7.8 mg/dL (ref 0.0–40.0)

## 2023-01-02 LAB — COMPREHENSIVE METABOLIC PANEL
ALT: 15 U/L (ref 0–53)
AST: 23 U/L (ref 0–37)
Albumin: 4 g/dL (ref 3.5–5.2)
Alkaline Phosphatase: 49 U/L (ref 39–117)
BUN: 16 mg/dL (ref 6–23)
CO2: 26 meq/L (ref 19–32)
Calcium: 8.6 mg/dL (ref 8.4–10.5)
Chloride: 103 meq/L (ref 96–112)
Creatinine, Ser: 0.99 mg/dL (ref 0.40–1.50)
GFR: 77.06 mL/min (ref >60.00)
Glucose, Bld: 113 mg/dL — ABNORMAL HIGH (ref 70–99)
Potassium: 4.2 meq/L (ref 3.5–5.1)
Sodium: 137 meq/L (ref 135–145)
Total Bilirubin: 0.3 mg/dL (ref 0.2–1.2)
Total Protein: 6.3 g/dL (ref 6.0–8.3)

## 2023-01-02 LAB — VITAMIN B12: Vitamin B-12: >1537 pg/mL — ABNORMAL HIGH (ref 211–911)

## 2023-01-02 NOTE — Telephone Encounter (Signed)
  Follow up Call-     01/01/2023    8:47 AM  Call back number  Post procedure Call Back phone  # [phone]  Permission to leave phone message Yes     Patient questions:  Do you have a fever, pain , or abdominal swelling? No. Pain Score  0 *  Have you tolerated food without any problems? Yes.    Have you been able to return to your normal activities? Yes.    Do you have any questions about your discharge instructions: Diet   No. Medications  No. Follow up visit  No.  Do you have questions or concerns about your Care? No.  Actions: * If pain score is 4 or above: No action needed, pain <4.

## 2023-01-04 ENCOUNTER — Encounter: Payer: Self-pay | Admitting: Family Medicine

## 2023-01-05 NOTE — Addendum Note (Signed)
Addended by: Christy Sartorius on: 01/05/2023 08:10 AM   Modules accepted: Orders

## 2023-01-12 ENCOUNTER — Telehealth: Payer: Self-pay

## 2023-01-12 DIAGNOSIS — Z860100 Personal history of colon polyps, unspecified: Secondary | ICD-10-CM

## 2023-01-12 MED ORDER — NA SULFATE-K SULFATE-MG SULF 17.5-3.13-1.6 GM/177ML PO SOLN
1.0000 | Freq: Once | ORAL | 0 refills | Status: AC
Start: 2023-01-12 — End: 2023-01-12

## 2023-01-12 NOTE — Telephone Encounter (Signed)
Per Dr Leone Payor patient's colonoscopy has been rescheduled for 02/27/2023 at 9:30am due to poor endoscopic visualization. This time he will use a 2 day prep and better fiber restriction. The report says he will apply abdominal binder due to his somewhat redundant colon. I have redone the instructions and will email them to him, amb referral placed and suprep kit sent in. Confirmed pharmacy and email address.

## 2023-01-21 ENCOUNTER — Other Ambulatory Visit: Payer: Self-pay

## 2023-01-21 ENCOUNTER — Ambulatory Visit (INDEPENDENT_AMBULATORY_CARE_PROVIDER_SITE_OTHER): Payer: Medicare Other | Admitting: Family Medicine

## 2023-01-21 VITALS — BP 136/88 | Ht 70.0 in | Wt 175.0 lb

## 2023-01-21 DIAGNOSIS — M1711 Unilateral primary osteoarthritis, right knee: Secondary | ICD-10-CM

## 2023-01-21 NOTE — Patient Instructions (Signed)
 Your pain is due to arthritis. These are the different medications you can take for this: Tylenol  500mg  1-2 tabs three times a day for pain. Voltaren gel, capsaicin, aspercreme, or biofreeze topically up to four times a day may also help with pain. Some supplements that may help for arthritis: Boswellia extract, curcumin, pycnogenol Cortisone injections are an option - you were given this today. If cortisone injections do not help, there are different types of shots that may help but they take longer to take effect (viscosupplementation with hyaluronic acid). It's important that you continue to stay active. Straight leg raises, knee extensions 3 sets of 10 once a day (add ankle weight if these become too easy). Consider physical therapy to strengthen muscles around the joint that hurts to take pressure off of the joint itself. Shoe inserts with good arch support may be helpful. Ice 15 minutes at a time 3-4 times a day as needed to help with pain. Compression sleeve when up and walking around can help with the swelling too. Follow up with me in 1 month.

## 2023-01-22 ENCOUNTER — Encounter: Payer: Self-pay | Admitting: Family Medicine

## 2023-01-22 NOTE — Progress Notes (Signed)
 PCP: Micheal Wolm ORN, MD  Subjective:   HPI: Patient is a 73 y.o. male here for right knee pain.  Patient's left knee is doing well. However, over the past 6+ months he's had problems with his right knee. Associated swelling. Pain worse laterally and has been progressive. Difficulty with full motion and has been limited in his activities as a result.  Past Medical History:  Diagnosis Date   Colon polyps    History of colonic polyps 08/13/2004   Qualifier: Diagnosis of  By: Avram MD, NOLIA Lupita BRAVO    Hypertension    Low testosterone     Wears contact lenses     Current Outpatient Medications on File Prior to Visit  Medication Sig Dispense Refill   aspirin 81 MG chewable tablet Chew 81 mg by mouth daily.     EPINEPHrine  (EPIPEN  2-PAK) 0.3 mg/0.3 mL IJ SOAJ injection Inject 0.3 mg into the muscle as needed for anaphylaxis. 2 each 1   Evolocumab  (REPATHA  SURECLICK) 140 MG/ML SOAJ Inject 140 mg into the skin every 14 (fourteen) days. 2 mL 11   losartan  (COZAAR ) 100 MG tablet Take 1 tablet (100 mg total) by mouth daily. 90 tablet 3   rosuvastatin  (CRESTOR ) 40 MG tablet Take 1 tablet (40 mg total) by mouth daily. (Patient taking differently: Take 20 mg by mouth every other day.) 90 tablet 3   tadalafil  (CIALIS ) 20 MG tablet Take one tablet by mouth every other day as needed for erectile dysfunction. 10 tablet 5   Testosterone  1.62 % GEL Apply 1 pump topically to each arm daily as directed 225 g 5   No current facility-administered medications on file prior to visit.    Past Surgical History:  Procedure Laterality Date   COLONOSCOPY     HAMMERTOE RECONSTRUCTION WITH WEIL OSTEOTOMY Right 03/16/2014   Procedure: RIGHT SECOND/THIRD METATARSAL WEIL OSTEOSTOMY AND HAMMERTOE CORRECTION;  Surgeon: Norleen Armor, MD;  Location: [facility];  Service: Orthopedics;  Laterality: Right;  ANESTHESIA: GENERAL/REGIONAL BLOCK   PROSTATE SURGERY  2010   bx   VARIOCOCELE REPAIR  1985     Allergies  Allergen Reactions   Red Dye #40 (Allura Red) Anaphylaxis    BP 136/88   Ht 5' 10 (1.778 m)   Wt 175 lb (79.4 kg)   BMI 25.11 kg/m      08/14/2021    3:21 PM  [facility]  Frequency of aerobic exercise (# of days/week) 7  Average time in minutes 60  Frequency of strengthening activities (# of days/week) 3        No data to display              Objective:  Physical Exam:  Gen: NAD, comfortable in exam room  Right knee: Mild effusion.  No other gross deformity, ecchymoses. Mild TTP joint lines.  No patellar facet tenderness. ROM 0 - 100 degrees. Negative ant/post drawers. Negative valgus/varus testing. Negative lachman.  Negative mcmurrays, apleys.  NV intact distally.   Assessment & Plan:  1. Right knee pain - with effusion.  Consistent with flare of arthritis.  Icing, tylenol , topical medications, supplements reviewed via instructions.  Icing, compression.  Steroid injection given today.  Quad strengthening.  F/u in 1 month.  After informed written consent timeout was performed, patient was lying supine on exam table. Right knee was prepped with alcohol swab and utilizing superolateral approach with ultrasound guidance, patient's right knee was injected intraarticularly with 3:1 lidocaine : depomedrol  40mg . Patient tolerated the procedure well without immediate complications.

## 2023-02-11 NOTE — Telephone Encounter (Signed)
Chart noted.

## 2023-02-26 NOTE — Progress Notes (Unsigned)
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 This is a repeat attempt with double prep after last exam weeks ago had unacceptable prep.   Past Medical History:  Diagnosis Date   Colon polyps    History of colonic polyps 08/13/2004   Qualifier: Diagnosis of  By: Leone Payor MD, Alfonse Ras E    Hypertension    Low testosterone    Wears contact lenses     Past Surgical History:  Procedure Laterality Date   COLONOSCOPY     HAMMERTOE RECONSTRUCTION WITH WEIL OSTEOTOMY Right 03/16/2014   Procedure: RIGHT SECOND/THIRD METATARSAL WEIL OSTEOSTOMY AND HAMMERTOE CORRECTION;  Surgeon: Toni Arthurs, MD;  Location: [facility];  Service: Orthopedics;  Laterality: Right;  ANESTHESIA: GENERAL/REGIONAL BLOCK   PROSTATE SURGERY  2010   bx   VARIOCOCELE REPAIR  1985    Prior to Admission medications   Medication Sig Start Date End Date Taking? Authorizing Provider  aspirin 81 MG chewable tablet Chew 81 mg by mouth daily.    [provider]  EPINEPHrine (EPIPEN 2-PAK) 0.3 mg/0.3 mL IJ SOAJ injection Inject 0.3 mg into the muscle as needed for anaphylaxis. 02/13/22   Burchette, Elberta Fortis, MD  Evolocumab (REPATHA SURECLICK) 140 MG/ML SOAJ Inject 140 mg into the skin every 14 (fourteen) days. 02/13/22   Burchette, Elberta Fortis, MD  losartan (COZAAR) 100 MG tablet Take 1 tablet (100 mg total) by mouth daily. 12/16/22   Burchette, Elberta Fortis, MD  rosuvastatin (CRESTOR) 40 MG tablet Take 1 tablet (40 mg total) by mouth daily. Patient taking differently: Take 20 mg by mouth every other day. 02/13/22   Burchette, Elberta Fortis, MD  tadalafil (CIALIS) 20 MG tablet Take one tablet by mouth every other day as needed for erectile dysfunction. 02/13/22   Burchette, Elberta Fortis, MD  Testosterone 1.62 % GEL Apply 1 pump  topically to each arm daily as directed 08/15/22   Kristian Covey, MD    Current Outpatient Medications  Medication Sig Dispense Refill   aspirin 81 MG chewable tablet Chew 81 mg by mouth daily.     losartan (COZAAR) 100 MG tablet Take 1 tablet (100 mg total) by mouth daily. 90 tablet 3   EPINEPHrine (EPIPEN 2-PAK) 0.3 mg/0.3 mL IJ SOAJ injection Inject 0.3 mg into the muscle as needed for anaphylaxis. (Patient not taking: Reported on 02/27/2023) 2 each 1   Evolocumab (REPATHA SURECLICK) 140 MG/ML SOAJ Inject 140 mg into the skin every 14 (fourteen) days. 2 mL 11   rosuvastatin (CRESTOR) 40 MG tablet Take 1 tablet (40 mg total) by mouth daily. (Patient taking differently: Take 20 mg by mouth every other day.) 90 tablet 3   tadalafil (CIALIS) 20 MG tablet Take one tablet by mouth every other day as needed for erectile dysfunction. 10 tablet 5   Testosterone 1.62 % GEL Apply 1 pump topically to each arm daily as directed 225 g 5   Current Facility-Administered Medications  Medication Dose Route Frequency Provider Last Rate Last Admin   0.9 %  sodium chloride infusion  500 mL Intravenous Once Iva Boop, MD        Allergies as of 02/27/2023   (No Active Allergies)    Family History  Problem Relation  Age of Onset   Hypertension Mother    Colon polyps Brother    Colon cancer Paternal Uncle        possible.  not sure   Esophageal cancer Neg Hx    Pancreatic cancer Neg Hx    Prostate cancer Neg Hx    Rectal cancer Neg Hx    Stomach cancer Neg Hx     Social History   Socioeconomic History   Marital status: Married    Spouse name: Not on file   Number of children: 3   Years of education: Not on file   Highest education level: Professional school degree (e.g., MD, DDS, DVM, JD)  Occupational History   Not on file  Tobacco Use   Smoking status: Never   Smokeless tobacco: Never  Vaping Use   Vaping status: Never Used  Substance and Sexual Activity   Alcohol use: Yes     Comment: occ. 2 glasses wine month per pt   Drug use: No   Sexual activity: Not on file  Other Topics Concern   Not on file  Social History Narrative   Not on file   Social Drivers of Health   Financial Resource Strain: Low Risk  (12/15/2022)   Overall Financial Resource Strain (CARDIA)    Difficulty of Paying Living Expenses: Not hard at all  Food Insecurity: No Food Insecurity (12/15/2022)   Hunger Vital Sign    Worried About Running Out of Food in the Last Year: Never true    Ran Out of Food in the Last Year: Never true  Transportation Needs: No Transportation Needs (12/15/2022)   PRAPARE - Administrator, Civil Service (Medical): No    Lack of Transportation (Non-Medical): No  Physical Activity: Sufficiently Active (12/15/2022)   Exercise Vital Sign    Days of Exercise per Week: 7 days    Minutes of Exercise per Session: 90 min  Stress: No Stress Concern Present (12/15/2022)   [employer] of Occupational Health - Occupational Stress Questionnaire    Feeling of Stress : Not at all  Social Connections: Socially Integrated (12/15/2022)   Social Connection and Isolation Panel [NHANES]    Frequency of Communication with Friends and Family: More than three times a week    Frequency of Social Gatherings with Friends and Family: Twice a week    Attends Religious Services: More than 4 times per year    Active Member of [employer] or Organizations: Yes    Attends Engineer, structural: More than 4 times per year    Marital Status: Married  Catering manager Violence: Not on file    Review of Systems:  All other review of systems negative except as mentioned in the HPI.  Physical Exam: Vital signs BP (!) 144/80   Pulse 75   Temp (!) 97.5 F (36.4 C) (Skin)   Ht 5\' 10"  (1.778 m)   Wt 175 lb (79.4 kg)   SpO2 98%   BMI 25.11 kg/m   General:   Alert,  Well-developed, well-nourished, pleasant and cooperative in NAD Lungs:  Clear throughout to auscultation.    Heart:  Regular rate and rhythm; no murmurs, clicks, rubs,  or gallops. Abdomen:  Soft, nontender and nondistended. Normal bowel sounds.   Neuro/Psych:  Alert and cooperative. Normal mood and affect. A and O x 3   @Domique Clapper  Sena Slate, MD, [facility] [phone] (pager) 02/27/2023 10:04 AM@

## 2023-02-27 ENCOUNTER — Encounter: Payer: Self-pay | Admitting: Internal Medicine

## 2023-02-27 ENCOUNTER — Ambulatory Visit: Payer: Medicare Other | Admitting: Internal Medicine

## 2023-02-27 VITALS — BP 104/65 | HR 75 | Temp 97.5°F | Resp 15 | Ht 70.0 in | Wt 175.0 lb

## 2023-02-27 DIAGNOSIS — Z1211 Encounter for screening for malignant neoplasm of colon: Secondary | ICD-10-CM

## 2023-02-27 DIAGNOSIS — Z860101 Personal history of adenomatous and serrated colon polyps: Secondary | ICD-10-CM

## 2023-02-27 DIAGNOSIS — Z860100 Personal history of colon polyps, unspecified: Secondary | ICD-10-CM

## 2023-02-27 DIAGNOSIS — I10 Essential (primary) hypertension: Secondary | ICD-10-CM | POA: Diagnosis not present

## 2023-02-27 DIAGNOSIS — D123 Benign neoplasm of transverse colon: Secondary | ICD-10-CM | POA: Diagnosis not present

## 2023-02-27 DIAGNOSIS — N400 Enlarged prostate without lower urinary tract symptoms: Secondary | ICD-10-CM | POA: Diagnosis not present

## 2023-02-27 MED ORDER — SODIUM CHLORIDE 0.9 % IV SOLN: 500.0000 mL | Freq: Once | INTRAVENOUS | Status: DC

## 2023-02-27 NOTE — Progress Notes (Signed)
Pt's states no medical or surgical changes since previsit or office visit.

## 2023-02-27 NOTE — Progress Notes (Signed)
Sedate, gd SR, tolerated procedure well, VSS, report to RN

## 2023-02-27 NOTE — Patient Instructions (Addendum)
One 2 mm polyp is all - removed and sent to pathology.  Will see what it was and decide about repeat colonoscopy yes/no.  Routine repeat recommendation should be 7-10 years so I am thinking do as needed and not routine.  Iva Boop, MD, FACG  YOU HAD AN ENDOSCOPIC PROCEDURE TODAY AT THE [facility]:   Refer to the procedure report that was given to you for any specific questions about what was found during the examination.  If the procedure report does not answer your questions, please call your gastroenterologist to clarify.  If you requested that your care partner not be given the details of your procedure findings, then the procedure report has been included in a sealed envelope for you to review at your convenience later.  YOU SHOULD EXPECT: Some feelings of bloating in the abdomen. Passage of more gas than usual.  Walking can help get rid of the air that was put into your GI tract during the procedure and reduce the bloating. If you had a lower endoscopy (such as a colonoscopy or flexible sigmoidoscopy) you may notice spotting of blood in your stool or on the toilet paper. If you underwent a bowel prep for your procedure, you may not have a normal bowel movement for a few days.  Please Note:  You might notice some irritation and congestion in your nose or some drainage.  This is from the oxygen used during your procedure.  There is no need for concern and it should clear up in a day or so.  SYMPTOMS TO REPORT IMMEDIATELY:  Following lower endoscopy (colonoscopy or flexible sigmoidoscopy):  Excessive amounts of blood in the stool  Significant tenderness or worsening of abdominal pains  Swelling of the abdomen that is new, acute  Fever of 100F or higher  For urgent or emergent issues, a gastroenterologist can be reached at any hour by calling [phone]. Do not use MyChart messaging for urgent concerns.    DIET:  We do recommend a small meal at first, but then you  may proceed to your regular diet.  Drink plenty of fluids but you should avoid alcoholic beverages for 24 hours.  ACTIVITY:  You should plan to take it easy for the rest of today and you should NOT DRIVE or use heavy machinery until tomorrow (because of the sedation medicines used during the test).    FOLLOW UP: Our staff will call the number listed on your records the next business day following your procedure.  We will call around 7:15- 8:00 am to check on you and address any questions or concerns that you may have regarding the information given to you following your procedure. If we do not reach you, we will leave a message.     If any biopsies were taken you will be contacted by phone or by letter within the next 1-3 weeks.  Please call us at [phone] if you have not heard about the biopsies in 3 weeks.    SIGNATURES/CONFIDENTIALITY: You and/or your care partner have signed paperwork which will be entered into your electronic medical record.  These signatures attest to the fact that that the information above on your After Visit Summary has been reviewed and is understood.  Full responsibility of the confidentiality of this discharge information lies with you and/or your care-partner.

## 2023-02-27 NOTE — Op Note (Signed)
[facility] Patient Name: Shane Perkins Procedure Date: 02/27/2023 10:11 AM MRN: [id number] Endoscopist: Iva Boop , MD, [id number] Age: 73 Referring MD:  Date of Birth: October 03, 1950 Gender: Male Account #: [id number] Procedure:                Colonoscopy Indications:              Surveillance: Personal history of adenomatous                            polyps on last colonoscopy > 5 years ago (Had                            incomplete exam last month and returns) Medicines:                Monitored Anesthesia Care Procedure:                Pre-Anesthesia Assessment:                           - Prior to the procedure, a History and Physical                            was performed, and patient medications and                            allergies were reviewed. The patient's tolerance of                            previous anesthesia was also reviewed. The risks                            and benefits of the procedure and the sedation                            options and risks were discussed with the patient.                            All questions were answered, and informed consent                            was obtained. Prior Anticoagulants: The patient has                            taken no anticoagulant or antiplatelet agents. ASA                            Grade Assessment: II - A patient with mild systemic                            disease. After reviewing the risks and benefits,                            the patient was deemed in satisfactory condition to  undergo the procedure.                           After obtaining informed consent, the colonoscope                            was passed under direct vision. Throughout the                            procedure, the patient's blood pressure, pulse, and                            oxygen saturations were monitored continuously. The                            Olympus Scope SN: [id number]  was introduced through                            the anus and advanced to the the cecum, identified                            by appendiceal orifice and ileocecal valve. The                            colonoscopy was performed without difficulty. The                            patient tolerated the procedure well. The quality                            of the bowel preparation was adequate. The                            ileocecal valve, appendiceal orifice, and rectum                            were photographed. The bowel preparation used was                            MiraLax and SUPREP via extended prep with split                            dose instruction. Scope In: 10:27:49 AM Scope Out: 10:53:35 AM Scope Withdrawal Time: 0 hours 22 minutes 6 seconds  Total Procedure Duration: 0 hours 25 minutes 46 seconds  Findings:                 The digital rectal exam findings include enlarged                            prostate. Pertinent negatives include no palpable                            rectal lesions.  A 2 mm polyp was found in the proximal transverse                            colon. The polyp was sessile. The polyp was removed                            with a cold snare. Resection and retrieval were                            complete. Verification of patient identification                            for the specimen was done. Estimated blood loss was                            minimal.                           The exam was otherwise without abnormality on                            direct and retroflexion views. Complications:            No immediate complications. Estimated Blood Loss:     Estimated blood loss was minimal. Impression:               - Enlarged prostate found on digital rectal exam.                           - One 2 mm polyp in the proximal transverse colon,                            removed with a cold snare. Resected and  retrieved.                           - The examination was otherwise normal on direct                            and retroflexion views.                           - Personal history of colonic polyps. 2006 8 mm                            adenoma                           2011 diminutive adenoma and one unrecovered polyp                           12/2015 1-2 mm adenoma Recommendation:           - Patient has a contact number available for                            emergencies.  The signs and symptoms of potential                            delayed complications were discussed with the                            patient. Return to normal activities tomorrow.                            Written discharge instructions were provided to the                            patient.                           - Resume previous diet.                           - Continue present medications.                           - Await pathology results.                           - No recommendation at this time regarding repeat                            colonoscopy due to age. Iva Boop, MD 02/27/2023 11:03:13 AM This report has been signed electronically.

## 2023-02-27 NOTE — Progress Notes (Signed)
Called to room to assist during endoscopic procedure.  Patient ID and intended procedure confirmed with present staff. Received instructions for my participation in the procedure from the performing physician.

## 2023-03-02 ENCOUNTER — Telehealth: Payer: Self-pay

## 2023-03-02 NOTE — Telephone Encounter (Signed)
  Follow up Call-     02/27/2023    9:20 AM 01/01/2023    8:47 AM  Call back number  Post procedure Call Back phone  # [phone]  Permission to leave phone message Yes Yes     Patient questions:  Do you have a fever, pain , or abdominal swelling? No. Pain Score  0 *  Have you tolerated food without any problems? Yes.    Have you been able to return to your normal activities? Yes.    Do you have any questions about your discharge instructions: Diet   No. Medications  No. Follow up visit  No.  Do you have questions or concerns about your Care? No.  Actions: * If pain score is 4 or above: No action needed, pain <4.

## 2023-03-03 LAB — SURGICAL PATHOLOGY

## 2023-03-04 ENCOUNTER — Encounter: Payer: Self-pay | Admitting: Internal Medicine

## 2023-03-07 ENCOUNTER — Other Ambulatory Visit: Payer: Self-pay | Admitting: Family Medicine

## 2023-06-13 ENCOUNTER — Other Ambulatory Visit: Payer: Self-pay | Admitting: Family Medicine

## 2023-07-27 ENCOUNTER — Ambulatory Visit: Payer: Self-pay | Admitting: Family Medicine

## 2023-07-27 ENCOUNTER — Ambulatory Visit: Admitting: Family Medicine

## 2023-07-27 ENCOUNTER — Ambulatory Visit (INDEPENDENT_AMBULATORY_CARE_PROVIDER_SITE_OTHER): Admitting: Family Medicine

## 2023-07-27 VITALS — BP 114/66 | HR 63 | Wt 183.1 lb

## 2023-07-27 DIAGNOSIS — R1012 Left upper quadrant pain: Secondary | ICD-10-CM

## 2023-07-27 DIAGNOSIS — H25013 Cortical age-related cataract, bilateral: Secondary | ICD-10-CM | POA: Diagnosis not present

## 2023-07-27 DIAGNOSIS — M25649 Stiffness of unspecified hand, not elsewhere classified: Secondary | ICD-10-CM | POA: Diagnosis not present

## 2023-07-27 DIAGNOSIS — Z125 Encounter for screening for malignant neoplasm of prostate: Secondary | ICD-10-CM | POA: Diagnosis not present

## 2023-07-27 LAB — COMPREHENSIVE METABOLIC PANEL WITH GFR
ALT: 11 U/L (ref 0–53)
AST: 15 U/L (ref 0–37)
Albumin: 4.3 g/dL (ref 3.5–5.2)
Alkaline Phosphatase: 52 U/L (ref 39–117)
BUN: 23 mg/dL (ref 6–23)
CO2: 25 meq/L (ref 19–32)
Calcium: 9.2 mg/dL (ref 8.4–10.5)
Chloride: 101 meq/L (ref 96–112)
Creatinine, Ser: 0.85 mg/dL (ref 0.40–1.50)
GFR: 87.55 mL/min (ref >60.00)
Glucose, Bld: 99 mg/dL (ref 70–99)
Potassium: 4.4 meq/L (ref 3.5–5.1)
Sodium: 134 meq/L — ABNORMAL LOW (ref 135–145)
Total Bilirubin: 0.4 mg/dL (ref 0.2–1.2)
Total Protein: 6.6 g/dL (ref 6.0–8.3)

## 2023-07-27 LAB — CBC WITH DIFFERENTIAL/PLATELET
Basophils Absolute: 0 K/uL (ref 0.0–0.1)
Basophils Relative: 0.9 % (ref 0.0–3.0)
Eosinophils Absolute: 0.2 K/uL (ref 0.0–0.7)
Eosinophils Relative: 3.9 % (ref 0.0–5.0)
HCT: 44.2 % (ref 39.0–52.0)
Hemoglobin: 14.7 g/dL (ref 13.0–17.0)
Lymphocytes Relative: 42.3 % (ref 12.0–46.0)
Lymphs Abs: 1.7 K/uL (ref 0.7–4.0)
MCHC: 33.3 g/dL (ref 30.0–36.0)
MCV: 97.8 fl (ref 78.0–100.0)
Monocytes Absolute: 0.5 K/uL (ref 0.1–1.0)
Monocytes Relative: 13.6 % — ABNORMAL HIGH (ref 3.0–12.0)
Neutro Abs: 1.5 K/uL (ref 1.4–7.7)
Neutrophils Relative %: 39.3 % — ABNORMAL LOW (ref 43.0–77.0)
Platelets: 204 K/uL (ref 150.0–400.0)
RBC: 4.52 Mil/uL (ref 4.22–5.81)
RDW: 13 % (ref 11.5–15.5)
WBC: 3.9 K/uL — ABNORMAL LOW (ref 4.0–10.5)

## 2023-07-27 LAB — SEDIMENTATION RATE: Sed Rate: 2 mm/h (ref 0–20)

## 2023-07-27 LAB — PSA, MEDICARE: PSA: 4.3 ng/mL — ABNORMAL HIGH (ref 0.10–4.00)

## 2023-07-27 NOTE — Progress Notes (Signed)
 Established Patient Office Visit  Subjective   Patient ID: Shane JONETTA Como, MD, male    DOB: [birth year]  Age: 73 y.o. MRN: [id number]  Chief Complaint  Patient presents with   Abdominal Pain   Joint Pain    HPI   Shane Perkins is seen with approximately 15-month history of pain mostly left upper quadrant.  He states this is relatively mild in intensity generally 2 out of 10.  Sometimes notices with deep breathing.  Below left lower rib region.  Epigastric to left upper quadrant.  No pain with direct pressure.  Denies any nausea or vomiting.  Appetite and weight stable.  He had been taking aspirin and occasional nonsteroidal and held aspirin and started over-the-counter omeprazole 20 mg daily but has not seen any change in pain intensity.  Also scaled back caffeine consumption somewhat.  Usually only couple of glasses of wine per week.  Very health-conscious and exercises regularly.  No recent exertional chest symptoms.  No stool changes.  Just had recent colonoscopy back in February which was normal.  Also relates some recent bilateral hand stiffness that seemed to come on relatively acutely.  Very symmetric and especially involving thumbs.  No visible swelling, warmth, or erythema.  No other major joint pains.  Chronic PSA elevation (with negative past cancer evaluation) and has been followed closely by Urology in past.    Past Medical History:  Diagnosis Date   Colon polyps    History of colonic polyps 08/13/2004   Qualifier: Diagnosis of  By: Avram MD, NOLIA Pitts E    Hypertension    Low testosterone     Wears contact lenses    Past Surgical History:  Procedure Laterality Date   COLONOSCOPY     HAMMERTOE RECONSTRUCTION WITH WEIL OSTEOTOMY Right 03/16/2014   Procedure: RIGHT SECOND/THIRD METATARSAL WEIL OSTEOSTOMY AND HAMMERTOE CORRECTION;  Surgeon: Norleen Armor, MD;  Location: [facility];  Service: Orthopedics;  Laterality: Right;  ANESTHESIA: GENERAL/REGIONAL BLOCK    PROSTATE SURGERY  2010   bx   VARIOCOCELE REPAIR  1985    reports that he has never smoked. He has never used smokeless tobacco. He reports current alcohol use. He reports that he does not use drugs. family history includes Colon cancer in his paternal uncle; Colon polyps in his brother; Hypertension in his mother. No Active Allergies  Review of Systems  Constitutional:  Negative for chills, fever and weight loss.  Respiratory:  Negative for shortness of breath.   Cardiovascular:  Negative for chest pain.  Gastrointestinal:  Positive for abdominal pain. Negative for blood in stool, constipation, diarrhea, melena, nausea and vomiting.  Genitourinary:  Negative for flank pain.      Objective:     BP 114/66 (BP Location: Left Arm, Patient Position: Sitting, Cuff Size: Normal)   Pulse 63   Wt 183 lb 1.6 oz (83.1 kg)   SpO2 95%   BMI 26.27 kg/m  BP Readings from Last 3 Encounters:  07/27/23 114/66  02/27/23 104/65  01/21/23 136/88   Wt Readings from Last 3 Encounters:  07/27/23 183 lb 1.6 oz (83.1 kg)  02/27/23 175 lb (79.4 kg)  01/21/23 175 lb (79.4 kg)      Physical Exam Vitals reviewed.  Constitutional:      General: He is not in acute distress.    Appearance: He is not ill-appearing.  Cardiovascular:     Rate and Rhythm: Normal rate.  Pulmonary:     Effort: Pulmonary effort is normal.  Breath sounds: Normal breath sounds. No wheezing or rales.  Abdominal:     Comments: Abdomen nondistended.  Normal bowel sounds.  Soft with no reproducible tenderness.  No hepatomegaly or splenomegaly noted.  No masses palpated.  No guarding or rebound.  Neurological:     Mental Status: He is alert.      Results for orders placed or performed in visit on 07/27/23  PSA, Medicare  Result Value Ref Range   PSA 4.30 (H) 0.10 - 4.00 ng/ml  Sedimentation rate  Result Value Ref Range   Sed Rate 2 0 - 20 mm/hr  CMP  Result Value Ref Range   Sodium 134 (L) 135 - 145 mEq/L    Potassium 4.4 3.5 - 5.1 mEq/L   Chloride 101 96 - 112 mEq/L   CO2 25 19 - 32 mEq/L   Glucose, Bld 99 70 - 99 mg/dL   BUN 23 6 - 23 mg/dL   Creatinine, Ser 9.14 0.40 - 1.50 mg/dL   Total Bilirubin 0.4 0.2 - 1.2 mg/dL   Alkaline Phosphatase 52 39 - 117 U/L   AST 15 0 - 37 U/L   ALT 11 0 - 53 U/L   Total Protein 6.6 6.0 - 8.3 g/dL   Albumin 4.3 3.5 - 5.2 g/dL   GFR 12.44 >39.99 mL/min   Calcium  9.2 8.4 - 10.5 mg/dL  CBC with Differential/Platelet  Result Value Ref Range   WBC 3.9 (L) 4.0 - 10.5 K/uL   RBC 4.52 4.22 - 5.81 Mil/uL   Hemoglobin 14.7 13.0 - 17.0 g/dL   HCT 55.7 60.9 - 47.9 %   MCV 97.8 78.0 - 100.0 fl   MCHC 33.3 30.0 - 36.0 g/dL   RDW 86.9 88.4 - 84.4 %   Platelets 204.0 150.0 - 400.0 K/uL   Neutrophils Relative % 39.3 (L) 43.0 - 77.0 %   Lymphocytes Relative 42.3 12.0 - 46.0 %   Monocytes Relative 13.6 (H) 3.0 - 12.0 %   Eosinophils Relative 3.9 0.0 - 5.0 %   Basophils Relative 0.9 0.0 - 3.0 %   Neutro Abs 1.5 1.4 - 7.7 K/uL   Lymphs Abs 1.7 0.7 - 4.0 K/uL   Monocytes Absolute 0.5 0.1 - 1.0 K/uL   Eosinophils Absolute 0.2 0.0 - 0.7 K/uL   Basophils Absolute 0.0 0.0 - 0.1 K/uL      The ASCVD Risk score (Arnett DK, et al., 2019) failed to calculate for the following reasons:   The valid total cholesterol range is 130 to 320 mg/dL    Assessment & Plan:   Problem List Items Addressed This Visit   None Visit Diagnoses       Abdominal pain, LUQ    -  Primary   Relevant Orders   CBC with Differential/Platelet (Completed)   CMP (Completed)   Sedimentation rate (Completed)     Stiffness of hand joint, unspecified laterality       Relevant Orders   Rheumatoid Factor   Cyclic citrul peptide antibody, IgG     Prostate cancer screening       Relevant Orders   PSA, Medicare (Completed)     24-month history of somewhat vague left upper quadrant abdominal pain.  Relatively low-grade pain but relatively persistent over the past couple months.  Does not have any  red flags such as appetite change, weight loss, fever.  Also describing relatively acute onset of increased stiffness involving predominately PIP and DIP joints of the hand and especially thumbs.  No acute inflammatory changes on exam.  Doubt acute inflammatory arthritis but will check labs below.  -Check labs - including CBC, CMP, sed rate, rheumatoid factor, CCP antibody and follow-up PSA - Set up CT abdomen pelvis with IV contrast to further evaluate - Follow-up immediately for any fever or worsening abdominal pain  No follow-ups on file.    Wolm Scarlet, MD

## 2023-07-28 ENCOUNTER — Ambulatory Visit (HOSPITAL_COMMUNITY)
Admission: RE | Admit: 2023-07-28 | Discharge: 2023-07-28 | Disposition: A | Discharge status: Home / Self Care | Source: Ambulatory Visit | Attending: Family Medicine | Admitting: Family Medicine

## 2023-07-28 DIAGNOSIS — N281 Cyst of kidney, acquired: Secondary | ICD-10-CM | POA: Diagnosis not present

## 2023-07-28 DIAGNOSIS — R1012 Left upper quadrant pain: Secondary | ICD-10-CM

## 2023-07-28 DIAGNOSIS — R1013 Epigastric pain: Secondary | ICD-10-CM | POA: Diagnosis not present

## 2023-07-28 DIAGNOSIS — N3289 Other specified disorders of bladder: Secondary | ICD-10-CM | POA: Diagnosis not present

## 2023-07-28 LAB — RHEUMATOID FACTOR: Rheumatoid fact SerPl-aCnc: <10 [IU]/mL (ref <14)

## 2023-07-28 LAB — CYCLIC CITRUL PEPTIDE ANTIBODY, IGG: Cyclic Citrullin Peptide Ab: <16 U

## 2023-07-28 MED ORDER — IOHEXOL 350 MG/ML SOLN: 75.0000 mL | Freq: Once | INTRAVENOUS | Status: DC | PRN

## 2023-07-30 ENCOUNTER — Encounter: Payer: Self-pay | Admitting: Family Medicine

## 2023-07-30 ENCOUNTER — Telehealth: Payer: Self-pay | Admitting: Internal Medicine

## 2023-07-30 ENCOUNTER — Encounter: Payer: Self-pay | Admitting: Internal Medicine

## 2023-07-30 DIAGNOSIS — T18108A Unspecified foreign body in esophagus causing other injury, initial encounter: Secondary | ICD-10-CM

## 2023-07-30 NOTE — Telephone Encounter (Signed)
 2 months of epigastric pain CT with 3 cm density in stomach that could possibly be a chicken bone  He will fast after MN and I will see if we can arrange an EGD at hospital tomorrow

## 2023-07-31 ENCOUNTER — Encounter: Payer: Self-pay | Admitting: Family Medicine

## 2023-07-31 ENCOUNTER — Ambulatory Visit (HOSPITAL_COMMUNITY): Admitting: Registered Nurse

## 2023-07-31 ENCOUNTER — Encounter (HOSPITAL_COMMUNITY): Payer: Self-pay | Admitting: Internal Medicine

## 2023-07-31 ENCOUNTER — Ambulatory Visit (HOSPITAL_COMMUNITY)
Admission: RE | Admit: 2023-07-31 | Discharge: 2023-07-31 | Disposition: A | Discharge status: Home / Self Care | Attending: Internal Medicine | Admitting: Internal Medicine

## 2023-07-31 ENCOUNTER — Other Ambulatory Visit: Payer: Self-pay

## 2023-07-31 ENCOUNTER — Encounter (HOSPITAL_COMMUNITY)
Admission: RE | Disposition: A | Discharge status: Home / Self Care | Payer: Self-pay | Source: Home / Self Care | Attending: Internal Medicine

## 2023-07-31 DIAGNOSIS — K2970 Gastritis, unspecified, without bleeding: Secondary | ICD-10-CM

## 2023-07-31 DIAGNOSIS — R1033 Periumbilical pain: Secondary | ICD-10-CM | POA: Insufficient documentation

## 2023-07-31 DIAGNOSIS — Z79899 Other long term (current) drug therapy: Secondary | ICD-10-CM | POA: Diagnosis not present

## 2023-07-31 DIAGNOSIS — R933 Abnormal findings on diagnostic imaging of other parts of digestive tract: Secondary | ICD-10-CM | POA: Insufficient documentation

## 2023-07-31 DIAGNOSIS — I10 Essential (primary) hypertension: Secondary | ICD-10-CM | POA: Diagnosis not present

## 2023-07-31 DIAGNOSIS — K2960 Other gastritis without bleeding: Secondary | ICD-10-CM | POA: Diagnosis not present

## 2023-07-31 DIAGNOSIS — R1013 Epigastric pain: Secondary | ICD-10-CM

## 2023-07-31 HISTORY — PX: ESOPHAGOGASTRODUODENOSCOPY: SHX5428

## 2023-07-31 SURGERY — EGD (ESOPHAGOGASTRODUODENOSCOPY)
Anesthesia: Monitor Anesthesia Care

## 2023-07-31 MED ORDER — SODIUM CHLORIDE 0.9 % IV SOLN
INTRAVENOUS | Status: AC | PRN
  Administered 2023-07-31: 500 mL via INTRAVENOUS

## 2023-07-31 MED ORDER — PHENYLEPHRINE HCL (PRESSORS) 10 MG/ML IV SOLN
INTRAVENOUS | Status: DC | PRN
  Administered 2023-07-31 (×2): 100 ug via INTRAVENOUS

## 2023-07-31 MED ORDER — PROPOFOL 500 MG/50ML IV EMUL
INTRAVENOUS | Status: DC | PRN
  Administered 2023-07-31: 40 mg via INTRAVENOUS
  Administered 2023-07-31 (×2): 50 mg via INTRAVENOUS
  Administered 2023-07-31: 100 mg via INTRAVENOUS

## 2023-07-31 MED ORDER — SODIUM CHLORIDE 0.9 % IV SOLN: INTRAVENOUS | Status: DC | PRN

## 2023-07-31 MED ORDER — LIDOCAINE 2% (20 MG/ML) 5 ML SYRINGE
INTRAMUSCULAR | Status: DC | PRN
  Administered 2023-07-31: 100 mg via INTRAVENOUS

## 2023-07-31 NOTE — Op Note (Addendum)
 [facility] Patient Name: Shane Perkins Procedure Date: 07/31/2023 MRN: [id number] Attending MD: Lupita FORBES Commander , MD, [id number] Date of Birth: 12-Dec-1950 CSN: [id number] Age: 73 Admit Type: Inpatient Procedure:                Upper GI endoscopy Indications:              Epigastric abdominal pain, Abnormal CT of the GI                            tract (? foreign body in stomach) Providers:                Lupita [name] Commander, MD, Ritta Debbie Alert, RN,                            [facility] [place], Technician, Cena Currier, CRNA Referring MD:              Medicines:                Monitored Anesthesia Care Complications:            No immediate complications. Estimated Blood Loss:     Estimated blood loss was minimal. Procedure:                Pre-Anesthesia Assessment:                           - Prior to the procedure, a History and Physical                            was performed, and patient medications and                            allergies were reviewed. The patient's tolerance of                            previous anesthesia was also reviewed. The risks                            and benefits of the procedure and the sedation                            options and risks were discussed with the patient.                            All questions were answered, and informed consent                            was obtained. Prior Anticoagulants: The patient has                            taken no anticoagulant or antiplatelet agents. ASA                            Grade Assessment: II - A patient with mild systemic  disease. After reviewing the risks and benefits,                            the patient was deemed in satisfactory condition to                            undergo the procedure.                           After obtaining informed consent, the endoscope was                            passed under direct vision. Throughout the                             procedure, the patient's blood pressure, pulse, and                            oxygen saturations were monitored continuously. The                            [id number] ([id number]) Olympus endoscope was introduced                            through the mouth, and advanced to the second part                            of duodenum. The upper GI endoscopy was                            accomplished without difficulty. The patient                            tolerated the procedure well. Scope In: Scope Out: Findings:      Patchy mildly erythematous mucosa without bleeding was found in the       gastric body and in the gastric antrum. Biopsies were taken with a cold       forceps for histology. Verification of patient identification for the       specimen was done. Estimated blood loss was minimal.      The exam was otherwise without abnormality.      The cardia and gastric fundus were normal on retroflexion. Impression:               - Erythematous mucosa in the gastric body and                            antrum. Biopsied - R/O gastritis and H. pylori.                            Sydney protocol used - bottle #1 antrum and #2 is                            body                           -  The examination was otherwise normal. Moderate Sedation:      Not Applicable - Patient had care per Anesthesia. Recommendation:           - Patient has a contact number available for                            emergencies. The signs and symptoms of potential                            delayed complications were discussed with the                            patient. Return to normal activities tomorrow.                            Written discharge instructions were provided to the                            patient.                           - Resume previous diet.                           - Continue present medications. He has been taking                            20 mg omeprazole  bid x 1 month w/o help. Stopped 81                            mg ASA 2 months ago (can restart)                           - Await pathology results. Treat H pylori if                            present. Consider antispasmodic. I did evaluate for                            musculoskeltal component in recovery - negative                            Carnett's sign so does not seem to be that.                            Consider stopping sit-ups and other exercises for 1                            month. Consider sports medicine evaluation. Procedure Code(s):        --- Professional ---                           [phone], Esophagogastroduodenoscopy, flexible,  transoral; with biopsy, single or multiple Diagnosis Code(s):        --- Professional ---                           K31.89, Other diseases of stomach and duodenum                           R10.13, Epigastric pain                           R93.3, Abnormal findings on diagnostic imaging of                            other parts of digestive tract CPT copyright 2022 [employer]. All rights reserved. The codes documented in this report are preliminary and upon coder review may  be revised to meet current compliance requirements. Lupita FORBES Commander, MD 07/31/2023 12:20:59 PM This report has been signed electronically. Number of Addenda: 0

## 2023-07-31 NOTE — Telephone Encounter (Signed)
 Orders placed. Message sent to pre-cert team.   Case built, spoke to charge nurse on Endo. Patient is scheduled for 12:15 start time with 10:45 arrival time. Patient updated on when he should arrive for procedure. Verbalized understanding.

## 2023-07-31 NOTE — Telephone Encounter (Signed)
 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

## 2023-07-31 NOTE — H&P (Signed)
 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 Had CT scan abd/pelvis 07/30/2023  w/ following findings:   IMPRESSION: 1. Residual food contents within the stomach, and a superimposed 3 cm, curvilinear and gracile hyperdense object (sagittal images 99 and 100). While this is nonspecific, ingested chicken bone can appear similar. No associated gastric inflammation identified.   2. No other acute or inflammatory process identified in the abdomen. Redundant large bowel.   3. Prostatomegaly with moderately distended urinary bladder. Past Medical History:  Diagnosis Date   Colon polyps    History of colonic polyps 08/13/2004   Qualifier: Diagnosis of  By: Avram MD, NOLIA Pitts E    Hypertension    Low testosterone     Wears contact lenses     Past Surgical History:  Procedure Laterality Date   COLONOSCOPY     HAMMERTOE RECONSTRUCTION WITH WEIL OSTEOTOMY Right 03/16/2014   Procedure: RIGHT SECOND/THIRD METATARSAL WEIL OSTEOSTOMY AND HAMMERTOE CORRECTION;  Surgeon: Norleen Armor, MD;  Location: [facility];  Service: Orthopedics;  Laterality: Right;  ANESTHESIA: GENERAL/REGIONAL BLOCK   PROSTATE SURGERY  2010   bx   VARIOCOCELE REPAIR  1985    Prior to Admission medications   Medication Sig Start Date End Date Taking? Authorizing Provider  losartan  (COZAAR ) 100 MG tablet Take 1 tablet (100 mg total) by mouth daily. 12/16/22  Yes Burchette, Wolm ORN, MD  rosuvastatin  (CRESTOR ) 40 MG tablet Take 1 tablet (40 mg total) by mouth daily. Patient taking differently: Take 20 mg by mouth every other day. 02/13/22  Yes Burchette, Wolm ORN, MD  Testosterone  1.62 % GEL APPLY 1 PUMP TOPICALLY TO EACH ARM EVERY DAY AS DIRECTED. 06/15/23  Yes Burchette,  Wolm ORN, MD  aspirin 81 MG chewable tablet Chew 81 mg by mouth daily.    [provider]  EPINEPHrine  (EPIPEN  2-PAK) 0.3 mg/0.3 mL IJ SOAJ injection Inject 0.3 mg into the muscle as needed for anaphylaxis. 02/13/22   Burchette, Wolm ORN, MD  REPATHA  SURECLICK 140 MG/ML SOAJ ADMINISTER 1 ML UNDER THE SKIN EVERY 14 DAYS 03/09/23   Burchette, Wolm ORN, MD  tadalafil  (CIALIS ) 20 MG tablet Take one tablet by mouth every other day as needed for erectile dysfunction. 02/13/22   Burchette, Wolm ORN, MD    Current Facility-Administered Medications  Medication Dose Route Frequency Provider Last Rate Last Admin   0.9 %  sodium chloride  infusion    Continuous PRN Avram Pitts BRAVO, MD 10 mL/hr at 07/31/23 1119 500 mL at 07/31/23 1119    Allergies as of 07/31/2023   (No Active Allergies)    Family History  Problem Relation Age of Onset   Hypertension Mother    Colon polyps Brother    Colon cancer Paternal Uncle        possible.  not sure   Esophageal cancer Neg Hx    Pancreatic cancer Neg Hx    Prostate cancer Neg Hx    Rectal cancer Neg Hx    Stomach cancer Neg Hx     Social History   Socioeconomic History   Marital status: Married    Spouse name: Not on file   Number of children: 3   Years of education:  Not on file   Highest education level: Professional school degree (e.g., MD, DDS, DVM, JD)  Occupational History   Not on file  Tobacco Use   Smoking status: Never   Smokeless tobacco: Never  Vaping Use   Vaping status: Never Used  Substance and Sexual Activity   Alcohol use: Yes    Comment: occ. 2 glasses wine month per pt   Drug use: No   Sexual activity: Not on file  Other Topics Concern   Not on file  Social History Narrative   Not on file   Social Drivers of Health   Financial Resource Strain: Low Risk  (07/27/2023)   Overall Financial Resource Strain (CARDIA)    Difficulty of Paying Living Expenses: Not hard at all  Food Insecurity: No Food Insecurity (07/27/2023)    Hunger Vital Sign    Worried About Running Out of Food in the Last Year: Never true    Ran Out of Food in the Last Year: Never true  Transportation Needs: No Transportation Needs (07/27/2023)   PRAPARE - Administrator, Civil Service (Medical): No    Lack of Transportation (Non-Medical): No  Physical Activity: Sufficiently Active (07/27/2023)   Exercise Vital Sign    Days of Exercise per Week: 6 days    Minutes of Exercise per Session: 80 min  Stress: No Stress Concern Present (07/27/2023)   [employer] of Occupational Health - Occupational Stress Questionnaire    Feeling of Stress: Not at all  Social Connections: Socially Integrated (07/27/2023)   Social Connection and Isolation Panel    Frequency of Communication with Friends and Family: Three times a week    Frequency of Social Gatherings with Friends and Family: Once a week    Attends Religious Services: More than 4 times per year    Active Member of [employer] or Organizations: Yes    Attends Engineer, structural: More than 4 times per year    Marital Status: Married  Catering manager Violence: Not on file    Review of Systems:  All other review of systems negative except as mentioned in the HPI.  Physical Exam: Vital signs Ht 5' 10 (1.778 m)   Wt 83.1 kg   BMI 26.29 kg/m   General:   Alert,  Well-developed, well-nourished, pleasant and cooperative in NAD Lungs:  Clear throughout to auscultation.   Heart:  Regular rate and rhythm; no murmurs, clicks, rubs,  or gallops. Abdomen:  Soft, nontender and nondistended. Normal bowel sounds.   Neuro/Psych:  Alert and cooperative. Normal mood and affect. A and O x 3   @Matthieu Loftus  [name] Commander, MD, [facility] [phone] (pager) 07/31/2023 11:42 AM@

## 2023-07-31 NOTE — Anesthesia Preprocedure Evaluation (Signed)
 Anesthesia Evaluation  Patient identified by MRN, date of birth, ID band Patient awake    Reviewed: Allergy & Precautions, NPO status   Airway Mallampati: I  TM Distance: >3 FB Neck ROM: Full    Dental  (+) Teeth Intact, Dental Advisory Given   Pulmonary neg pulmonary ROS   breath sounds clear to auscultation       Cardiovascular hypertension, Pt. on medications  Rhythm:Regular Rate:Normal     Neuro/Psych negative neurological ROS     GI/Hepatic negative GI ROS, Neg liver ROS,,,  Endo/Other  negative endocrine ROS    Renal/GU negative Renal ROS     Musculoskeletal  (+) Arthritis , Osteoarthritis,    Abdominal   Peds  Hematology negative hematology ROS (+)   Anesthesia Other Findings   Reproductive/Obstetrics                              Anesthesia Physical Anesthesia Plan  ASA: II  Anesthesia Plan: MAC   Post-op Pain Management: Minimal or no pain anticipated   Induction: Intravenous  PONV Risk Score and Plan: 1 and Propofol  infusion and Treatment may vary due to age or medical condition  Airway Management Planned: Nasal Cannula  Additional Equipment:   Intra-op Plan:   Post-operative Plan:   Informed Consent: I have reviewed the patients History and Physical, chart, labs and discussed the procedure including the risks, benefits and alternatives for the proposed anesthesia with the patient or authorized representative who has indicated his/her understanding and acceptance.     Dental advisory given  Plan Discussed with: CRNA  Anesthesia Plan Comments:          Anesthesia Quick Evaluation

## 2023-07-31 NOTE — Transfer of Care (Signed)
 Immediate Anesthesia Transfer of Care Note  Patient: Shane JONETTA Como, MD  Procedure(s) Performed: EGD (ESOPHAGOGASTRODUODENOSCOPY)  Patient Location: PACU  Anesthesia Type:MAC  Level of Consciousness: sedated  Airway & Oxygen Therapy: Patient Spontanous Breathing and Patient connected to face mask oxygen  Post-op Assessment: Report given to RN and Post -op Vital signs reviewed and stable  Post vital signs: Reviewed and stable  Last Vitals:  Vitals Value Taken Time  BP 108/68 07/31/23 12:13  Temp    Pulse 62 07/31/23 12:13  Resp 14 07/31/23 12:13  SpO2 98 % 07/31/23 12:13    Last Pain:  Vitals:   07/31/23 1109  PainSc: 0-No pain      Patients Stated Pain Goal: 0 (07/31/23 1109)  Complications: No notable events documented.

## 2023-07-31 NOTE — Progress Notes (Signed)
 Opened in error

## 2023-07-31 NOTE — Discharge Instructions (Addendum)
 The stomach had some patchy erythematous areas that could be gastritis. Biopsies taken.  No ulcers, no erosions and no chicken bone.  Once I see the pathology will make recommendations.  I appreciate the opportunity to care for you. Lupita [name] Commander, MD, FACG  YOU HAD AN ENDOSCOPIC PROCEDURE TODAY: Refer to the procedure report and other information in the discharge instructions given to you for any specific questions about what was found during the examination. If this information does not answer your questions, please call Dr. Darilyn office at [phone] to clarify.   YOU SHOULD EXPECT: Some feelings of bloating in the abdomen. Passage of more gas than usual. Walking can help get rid of the air that was put into your GI tract during the procedure and reduce the bloating. If you had a lower endoscopy (such as a colonoscopy or flexible sigmoidoscopy) you may notice spotting of blood in your stool or on the toilet paper. Some abdominal soreness may be present for a day or two, also.  DIET: Your first meal following the procedure should be a light meal and then it is ok to progress to your normal diet. A half-sandwich or bowl of soup is an example of a good first meal. Heavy or fried foods are harder to digest and may make you feel nauseous or bloated. Drink plenty of fluids but you should avoid alcoholic beverages for 24 hours.   ACTIVITY: Your care partner should take you home directly after the procedure. You should plan to take it easy, moving slowly for the rest of the day. You can resume normal activity the day after the procedure however YOU SHOULD NOT DRIVE, use power tools, machinery or perform tasks that involve climbing or major physical exertion for 24 hours (because of the sedation medicines used during the test).   SYMPTOMS TO REPORT IMMEDIATELY: A gastroenterologist can be reached at any hour. Please call [phone]  for any of the following symptoms:   Following upper  endoscopy (EGD, EUS, ERCP, esophageal dilation) Vomiting of blood or coffee ground material  New, significant abdominal pain  New, significant chest pain or pain under the shoulder blades  Painful or persistently difficult swallowing  New shortness of breath  Black, tarry-looking or red, bloody stools  FOLLOW UP:  If any biopsies were taken you will be contacted by phone or by letter within the next 1-3 weeks. Call [phone]  if you have not heard about the biopsies in 3 weeks.  Please also call with any specific questions about appointments or follow up tests.

## 2023-08-01 ENCOUNTER — Encounter (HOSPITAL_COMMUNITY): Payer: Self-pay | Admitting: Internal Medicine

## 2023-08-03 LAB — SURGICAL PATHOLOGY

## 2023-08-03 NOTE — Anesthesia Postprocedure Evaluation (Signed)
 Anesthesia Post Note  Patient: Shane JONETTA Como, MD  Procedure(s) Performed: EGD (ESOPHAGOGASTRODUODENOSCOPY)     Patient location during evaluation: PACU Anesthesia Type: MAC Level of consciousness: awake and alert Pain management: pain level controlled Vital Signs Assessment: post-procedure vital signs reviewed and stable Respiratory status: spontaneous breathing, nonlabored ventilation and respiratory function stable Cardiovascular status: blood pressure returned to baseline and stable Postop Assessment: no apparent nausea or vomiting Anesthetic complications: no   No notable events documented.  Last Vitals:  Vitals:   07/31/23 1230 07/31/23 1240  BP: 123/71 126/69  Pulse: 65 (!) 55  Resp: 13 12  Temp:    SpO2: 97% 97%    Last Pain:  Vitals:   07/31/23 1240  TempSrc:   PainSc: 0-No pain                 Butler Levander Pinal

## 2023-08-05 ENCOUNTER — Ambulatory Visit: Payer: Self-pay | Admitting: Internal Medicine

## 2023-10-28 DIAGNOSIS — M170 Bilateral primary osteoarthritis of knee: Secondary | ICD-10-CM | POA: Diagnosis not present

## 2023-10-28 DIAGNOSIS — M25561 Pain in right knee: Secondary | ICD-10-CM | POA: Diagnosis not present

## 2023-10-28 DIAGNOSIS — M179 Osteoarthritis of knee, unspecified: Secondary | ICD-10-CM | POA: Diagnosis not present

## 2023-10-28 DIAGNOSIS — M1712 Unilateral primary osteoarthritis, left knee: Secondary | ICD-10-CM | POA: Diagnosis not present

## 2023-10-28 DIAGNOSIS — M25562 Pain in left knee: Secondary | ICD-10-CM | POA: Diagnosis not present

## 2023-12-01 DIAGNOSIS — N401 Enlarged prostate with lower urinary tract symptoms: Secondary | ICD-10-CM | POA: Diagnosis not present

## 2023-12-01 DIAGNOSIS — N138 Other obstructive and reflux uropathy: Secondary | ICD-10-CM | POA: Diagnosis not present

## 2023-12-01 DIAGNOSIS — E291 Testicular hypofunction: Secondary | ICD-10-CM | POA: Diagnosis not present

## 2023-12-01 DIAGNOSIS — R972 Elevated prostate specific antigen [PSA]: Secondary | ICD-10-CM | POA: Diagnosis not present

## 2023-12-01 DIAGNOSIS — N529 Male erectile dysfunction, unspecified: Secondary | ICD-10-CM | POA: Diagnosis not present

## 2023-12-18 ENCOUNTER — Ambulatory Visit: Payer: Self-pay | Admitting: Family Medicine

## 2023-12-18 ENCOUNTER — Ambulatory Visit: Admitting: Family Medicine

## 2023-12-18 ENCOUNTER — Encounter: Payer: Self-pay | Admitting: Family Medicine

## 2023-12-18 VITALS — BP 130/80 | HR 64 | Temp 97.8°F | Wt 186.7 lb

## 2023-12-18 DIAGNOSIS — I10 Essential (primary) hypertension: Secondary | ICD-10-CM

## 2023-12-18 DIAGNOSIS — Z01818 Encounter for other preprocedural examination: Secondary | ICD-10-CM

## 2023-12-18 NOTE — Progress Notes (Unsigned)
 Established Patient Office Visit  Subjective   Patient ID: Shane JONETTA Como, MD, male    DOB: [birth year]  Age: 73 y.o. MRN: [id number]  Chief Complaint  Patient presents with   Pre-op Exam    HPI    [name] is here for preoperative clearance for right total knee replacement in January.  He has history of high coronary calcium  score, dyslipidemia, mildly elevated LP(a) but no history of angina or ischemic heart disease.  No history of heart failure.  Hypertension controlled with Losartan     No prior history of cerebrovascular disease.  He has no history of diabetes and had recent A1c 5.5%.  Normal renal function.  Exercises regularly with elliptical particularly.  No recent chest pains, dizziness or dyspnea with exercise  He is on aggressive lipid management with rosuvastatin  and Repatha .  Also takes baby aspirin 1 daily  Past Medical History:  Diagnosis Date   Colon polyps    History of colonic polyps 08/13/2004   Qualifier: Diagnosis of  By: Avram MD, NOLIA Pitts E    Hypertension    Low testosterone     Wears contact lenses    Past Surgical History:  Procedure Laterality Date   COLONOSCOPY     ESOPHAGOGASTRODUODENOSCOPY N/A 07/31/2023   Procedure: EGD (ESOPHAGOGASTRODUODENOSCOPY);  Surgeon: Avram Pitts BRAVO, MD;  Location: [facility];  Service: Gastroenterology;  Laterality: N/A;   HAMMERTOE RECONSTRUCTION WITH WEIL OSTEOTOMY Right 03/16/2014   Procedure: RIGHT SECOND/THIRD METATARSAL WEIL OSTEOSTOMY AND HAMMERTOE CORRECTION;  Surgeon: Norleen Armor, MD;  Location: [facility];  Service: Orthopedics;  Laterality: Right;  ANESTHESIA: GENERAL/REGIONAL BLOCK   PROSTATE SURGERY  2010   bx   VARIOCOCELE REPAIR  1985    reports that he has never smoked. He has never used smokeless tobacco. He reports current alcohol use. He reports that he does not use drugs. family history includes Colon cancer in his paternal uncle; Colon polyps in his brother; Hypertension in his  mother. No Active Allergies  Review of Systems  Constitutional:  Negative for chills, fever and malaise/fatigue.  Eyes:  Negative for blurred vision.  Respiratory:  Negative for shortness of breath.   Cardiovascular:  Negative for chest pain.  Gastrointestinal:  Negative for abdominal pain.  Neurological:  Negative for dizziness, weakness and headaches.      Objective:     BP 130/80   Pulse 64   Temp 97.8 F (36.6 C) (Oral)   Wt 186 lb 11.2 oz (84.7 kg)   SpO2 98%   BMI 26.79 kg/m  BP Readings from Last 3 Encounters:  12/18/23 130/80  07/31/23 126/69  07/27/23 114/66   Wt Readings from Last 3 Encounters:  12/18/23 186 lb 11.2 oz (84.7 kg)  07/31/23 183 lb 3.2 oz (83.1 kg)  07/27/23 183 lb 1.6 oz (83.1 kg)      Physical Exam Vitals reviewed.  Constitutional:      General: He is not in acute distress.    Appearance: He is well-developed. He is not ill-appearing.  Eyes:     Pupils: Pupils are equal, round, and reactive to light.  Neck:     Thyroid: No thyromegaly.     Comments: No carotid bruits Cardiovascular:     Rate and Rhythm: Normal rate and regular rhythm.  Pulmonary:     Effort: Pulmonary effort is normal. No respiratory distress.     Breath sounds: Normal breath sounds. No wheezing or rales.  Musculoskeletal:     Comments: Some obvious  edema right knee which is chronic  Neurological:     Mental Status: He is alert and oriented to person, place, and time.      No results found for any visits on 12/18/23.  Last CBC Lab Results  Component Value Date   WBC 3.9 (L) 07/27/2023   HGB 14.7 07/27/2023   HCT 44.2 07/27/2023   MCV 97.8 07/27/2023   MCH 32.4 10/17/2022   RDW 13.0 07/27/2023   PLT 204.0 07/27/2023   Last metabolic panel Lab Results  Component Value Date   GLUCOSE 99 07/27/2023   NA 134 (L) 07/27/2023   K 4.4 07/27/2023   CL 101 07/27/2023   CO2 25 07/27/2023   BUN 23 07/27/2023   CREATININE 0.85 07/27/2023   GFR 87.55  07/27/2023   CALCIUM  9.2 07/27/2023   PROT 6.6 07/27/2023   ALBUMIN 4.3 07/27/2023   BILITOT 0.4 07/27/2023   ALKPHOS 52 07/27/2023   AST 15 07/27/2023   ALT 11 07/27/2023   ANIONGAP 7 03/15/2014   Last lipids Lab Results  Component Value Date   CHOL 94 01/02/2023   HDL 45.80 01/02/2023   LDLCALC 40 01/02/2023   TRIG 39.0 01/02/2023   CHOLHDL 2 01/02/2023      The ASCVD Risk score (Arnett DK, et al., 2019) failed to calculate for the following reasons:   The valid total cholesterol range is 130 to 320 mg/dL    Assessment & Plan:   Preoperative assessment for right total knee replacement.  His revised cardiac risk index score would be 0 which equates to 0.5% risk of major complication.  No recent angina symptoms.  He plans to get preop labs few days prior to surgery.  He knows to hold aspirin around 5 days in advance of surgery  -Obtain EKG- NSR with no acute ST-T wave changes.   -no medical contraindications for surgery.     Wolm Scarlet, MD

## 2023-12-19 ENCOUNTER — Other Ambulatory Visit: Payer: Self-pay | Admitting: Family Medicine

## 2024-01-15 NOTE — Patient Instructions (Addendum)
 SURGICAL WAITING ROOM VISITATION Patients having surgery or a procedure may have no more than 2 support people in the waiting area - these visitors may rotate.    Children under the age of 33 will not be allowed to visit due to the increase in respiratory illness  Children under the age of 79 must have an adult with them who is not the patient.  If the patient needs to stay at the hospital during part of their recovery, the visitor guidelines for inpatient rooms apply. Pre-op nurse will coordinate an appropriate time for 1 support person to accompany patient in pre-op.  This support person may not rotate.    Please refer to the [facility] website for the visitor guidelines for Inpatients (after your surgery is over and you are in a regular room).       Your procedure is scheduled on: 01-28-24   Report to [facility] Main Entrance    Report to admitting at 5:15 AM   Call this number if you have problems the morning of surgery [phone]   Do not eat food :After Midnight.   After Midnight you may have the following liquids until 4:15 AM DAY OF SURGERY  Water Non-Citrus Juices (without pulp, NO RED-Apple, White grape, White cranberry) Black Coffee (NO MILK/CREAM OR CREAMERS, sugar ok)  Clear Tea (NO MILK/CREAM OR CREAMERS, sugar ok) regular and decaf                             Plain Jell-O (NO RED)                                           Fruit ices (not with fruit pulp, NO RED)                                     Popsicles (NO RED)                                                               Sports drinks like Gatorade (NO RED)                   The day of surgery:  Drink ONE (1) Pre-Surgery Clear Ensure by 4:15 AM the morning of surgery. Drink in one sitting. Do not sip.  This drink was given to you during your hospital  pre-op appointment visit. Nothing else to drink after completing the Pre-Surgery Clear Ensure.          If you have questions, please contact  your surgeons office.   FOLLOW  ANY ADDITIONAL PRE OP INSTRUCTIONS YOU RECEIVED FROM YOUR SURGEON'S OFFICE!!!     Oral Hygiene is also important to reduce your risk of infection.                                    Remember - BRUSH YOUR TEETH THE MORNING OF SURGERY WITH YOUR REGULAR TOOTHPASTE   Do NOT smoke after Midnight  Take these medicines the morning of surgery with A SIP OF WATER:    Rosuvastatin   Stop all vitamins and herbal supplements 7 days before surgery             You may not have any metal on your body including jewelry, and body piercing             Do not wear  lotions, powders, cologne, or deodorant              Men may shave face and neck.   Do not bring valuables to the hospital. [facility] IS NOT RESPONSIBLE   FOR VALUABLES.   Contacts, dentures or bridgework may not be worn into surgery.  DO NOT BRING YOUR HOME MEDICATIONS TO THE HOSPITAL. PHARMACY WILL DISPENSE MEDICATIONS LISTED ON YOUR MEDICATION LIST TO YOU DURING YOUR ADMISSION IN THE HOSPITAL!    Patients discharged on the day of surgery will not be allowed to drive home.  Someone NEEDS to stay with you for the first 24 hours after anesthesia.   Special Instructions: Bring a copy of your healthcare power of attorney and living will documents the day of surgery if you haven't scanned them before.              Please read over the following fact sheets you were given: IF YOU HAVE QUESTIONS ABOUT YOUR PRE-OP INSTRUCTIONS PLEASE CALL [phone] G[facility]  If you received a COVID test during your pre-op visit  it is requested that you wear a mask when out in public, stay away from anyone that may not be feeling well and notify your surgeon if you develop symptoms. If you test positive for Covid or have been in contact with anyone that has tested positive in the last 10 days please notify you surgeon.   Pre-operative 4 CHG Bath Instructions  DYNA-Hex 4 Chlorhexidine  Gluconate 4% Solution Antiseptic 4 fl.  oz   You can play a key role in reducing the risk of infection after surgery. Your skin needs to be as free of germs as possible. You can reduce the number of germs on your skin by washing with CHG (chlorhexidine  gluconate) soap before surgery. CHG is an antiseptic soap that kills germs and continues to kill germs even after washing.   DO NOT use if you have an allergy to chlorhexidine /CHG or antibacterial soaps. If your skin becomes reddened or irritated, stop using the CHG and notify one of our RNs at   Please shower with the CHG soap starting 4 days before surgery using the following schedule:     Please keep in mind the following:  DO NOT shave, including legs and underarms, starting the day of your first shower.   You may shave your face at any point before/day of surgery.  Place clean sheets on your bed the day you start using CHG soap. Use a clean washcloth (not used since being washed) for each shower. DO NOT sleep with pets once you start using the CHG.  CHG Shower Instructions:  If you choose to wash your hair and private area, wash first with your normal shampoo/soap.  After you use shampoo/soap, rinse your hair and body thoroughly to remove shampoo/soap residue.  Turn the water OFF and apply about 3 tablespoons (45 ml) of CHG soap to a CLEAN washcloth.  Apply CHG soap ONLY FROM YOUR NECK DOWN TO YOUR TOES (washing for 3-5 minutes)  DO NOT use CHG soap on face, private areas, open wounds,  or sores.  Pay special attention to the area where your surgery is being performed.  If you are having back surgery, having someone wash your back for you may be helpful. Wait 2 minutes after CHG soap is applied, then you may rinse off the CHG soap.  Pat dry with a clean towel  Put on clean clothes/pajamas   If you choose to wear lotion, please use ONLY the CHG-compatible lotions on the back of this paper.     Additional instructions for the day of surgery:  Shower with regular soap the day  of surgery DO NOT APPLY any lotions, deodorants, cologne, or perfumes.   Put on clean/comfortable clothes.  Brush your teeth.  Ask your nurse before applying any prescription medications to the skin.   CHG Compatible Lotions   Aveeno Moisturizing lotion  Cetaphil Moisturizing Cream  Cetaphil Moisturizing Lotion  Clairol Herbal Essence Moisturizing Lotion, Dry Skin  Clairol Herbal Essence Moisturizing Lotion, Extra Dry Skin  Clairol Herbal Essence Moisturizing Lotion, Normal Skin  Curel Age Defying Therapeutic Moisturizing Lotion with Alpha Hydroxy  Curel Extreme Care Body Lotion  Curel Soothing Hands Moisturizing Hand Lotion  Curel Therapeutic Moisturizing Cream, Fragrance-Free  Curel Therapeutic Moisturizing Lotion, Fragrance-Free  Curel Therapeutic Moisturizing Lotion, Original Formula  Eucerin Daily Replenishing Lotion  Eucerin Dry Skin Therapy Plus Alpha Hydroxy Crme  Eucerin Dry Skin Therapy Plus Alpha Hydroxy Lotion  Eucerin Original Crme  Eucerin Original Lotion  Eucerin Plus Crme Eucerin Plus Lotion  Eucerin TriLipid Replenishing Lotion  Keri Anti-Bacterial Hand Lotion  Keri Deep Conditioning Original Lotion Dry Skin Formula Softly Scented  Keri Deep Conditioning Original Lotion, Fragrance Free Sensitive Skin Formula  Keri Lotion Fast Absorbing Fragrance Free Sensitive Skin Formula  Keri Lotion Fast Absorbing Softly Scented Dry Skin Formula  Keri Original Lotion  Keri Skin Renewal Lotion Keri Silky Smooth Lotion  Keri Silky Smooth Sensitive Skin Lotion  Nivea Body Creamy Conditioning Oil  Nivea Body Extra Enriched Lotion  Nivea Body Original Lotion  Nivea Body Sheer Moisturizing Lotion Nivea Crme  Nivea Skin Firming Lotion  NutraDerm 30 Skin Lotion  NutraDerm Skin Lotion  NutraDerm Therapeutic Skin Cream  NutraDerm Therapeutic Skin Lotion  ProShield Protective Hand Cream  Provon moisturizing lotion   PATIENT  SIGNATURE_________________________________  NURSE SIGNATURE__________________________________  ________________________________________________________________________    Shane Perkins  An incentive spirometer is a tool that can help keep your lungs clear and active. This tool measures how well you are filling your lungs with each breath. Taking long deep breaths may help reverse or decrease the chance of developing breathing (pulmonary) problems (especially infection) following: A long period of time when you are unable to move or be active. BEFORE THE PROCEDURE  If the spirometer includes an indicator to show your best effort, your nurse or respiratory therapist will set it to a desired goal. If possible, sit up straight or lean slightly forward. Try not to slouch. Hold the incentive spirometer in an upright position. INSTRUCTIONS FOR USE  Sit on the edge of your bed if possible, or sit up as far as you can in bed or on a chair. Hold the incentive spirometer in an upright position. Breathe out normally. Place the mouthpiece in your mouth and seal your lips tightly around it. Breathe in slowly and as deeply as possible, raising the piston or the ball toward the top of the column. Hold your breath for 3-5 seconds or for as long as possible. Allow the piston or ball to fall  to the bottom of the column. Remove the mouthpiece from your mouth and breathe out normally. Rest for a few seconds and repeat Steps 1 through 7 at least 10 times every 1-2 hours when you are awake. Take your time and take a few normal breaths between deep breaths. The spirometer may include an indicator to show your best effort. Use the indicator as a goal to work toward during each repetition. After each set of 10 deep breaths, practice coughing to be sure your lungs are clear. If you have an incision (the cut made at the time of surgery), support your incision when coughing by placing a pillow or rolled up towels  firmly against it. Once you are able to get out of bed, walk around indoors and cough well. You may stop using the incentive spirometer when instructed by your caregiver.  RISKS AND COMPLICATIONS Take your time so you do not get dizzy or light-headed. If you are in pain, you may need to take or ask for pain medication before doing incentive spirometry. It is harder to take a deep breath if you are having pain. AFTER USE Rest and breathe slowly and easily. It can be helpful to keep track of a log of your progress. Your caregiver can provide you with a simple table to help with this. If you are using the spirometer at home, follow these instructions: SEEK MEDICAL CARE IF:  You are having difficultly using the spirometer. You have trouble using the spirometer as often as instructed. Your pain medication is not giving enough relief while using the spirometer. You develop fever of 100.5 F (38.1 C) or higher. SEEK IMMEDIATE MEDICAL CARE IF:  You cough up bloody sputum that had not been present before. You develop fever of 102 F (38.9 C) or greater. You develop worsening pain at or near the incision site. MAKE SURE YOU:  Understand these instructions. Will watch your condition. Will get help right away if you are not doing well or get worse. Document Released: 05/12/2006 Document Revised: 03/24/2011 Document Reviewed: 07/13/2006 [facility] Patient Information 2014 [place], [place].

## 2024-01-15 NOTE — Progress Notes (Addendum)
 Date of COVID positive in last 90 days: Had flu mid December.  All symptoms resolved  PCP - Wolm Scarlet, MD Cardiologist - Vinie Maxcy, MD  Medical clearance in Epic dated 12-18-23  Chest x-ray - N/A EKG - 12-18-23 Epic Stress Test - 11-09-03 Epic ECHO - N/A Cardiac Cath - N/A Coronary CT - 02-08-20 Epic Pacemaker/ICD device last checked:N/A Spinal Cord Stimulator:N/A  Bowel Prep - N/A  Sleep Study - N/A CPAP -   Fasting Blood Sugar - N/A Checks Blood Sugar _____ times a day  Last dose of GLP1 agonist-  N/A GLP1 instructions:  Do not take after     Last dose of SGLT-2 inhibitors-  N/A SGLT-2 instructions:  Do not take after    Blood Thinner Instructions: N/A Last dose:   Time: Aspirin Instructions:  ASA 81 (hold x5 days) Patient is aware Last Dose:  Activity level:  Can go up a flight of stairs and perform activities of daily living without stopping and without symptoms of chest pain or shortness of breath.  Able to exercise without symptoms  Anesthesia review: Abnormal calcium  score, HTN  Patient denies shortness of breath, fever, cough and chest pain at PAT appointment  Patient verbalized understanding of instructions that were given to them at the PAT appointment. Patient was also instructed that they will need to review over the PAT instructions again at home before surgery.

## 2024-01-19 ENCOUNTER — Encounter (HOSPITAL_COMMUNITY)
Admission: RE | Admit: 2024-01-19 | Discharge: 2024-01-19 | Disposition: A | Discharge status: Home / Self Care | Source: Ambulatory Visit | Attending: Orthopedic Surgery | Admitting: Orthopedic Surgery

## 2024-01-19 ENCOUNTER — Encounter (HOSPITAL_COMMUNITY): Payer: Self-pay

## 2024-01-19 ENCOUNTER — Other Ambulatory Visit: Payer: Self-pay

## 2024-01-19 VITALS — BP 142/95 | HR 67 | Temp 98.7°F | Resp 16 | Ht 70.0 in | Wt 179.0 lb

## 2024-01-19 DIAGNOSIS — Z01818 Encounter for other preprocedural examination: Secondary | ICD-10-CM

## 2024-01-19 DIAGNOSIS — I10 Essential (primary) hypertension: Secondary | ICD-10-CM | POA: Insufficient documentation

## 2024-01-19 DIAGNOSIS — R001 Bradycardia, unspecified: Secondary | ICD-10-CM | POA: Diagnosis not present

## 2024-01-19 DIAGNOSIS — M1711 Unilateral primary osteoarthritis, right knee: Secondary | ICD-10-CM | POA: Diagnosis not present

## 2024-01-19 DIAGNOSIS — Z01812 Encounter for preprocedural laboratory examination: Secondary | ICD-10-CM | POA: Diagnosis present

## 2024-01-19 DIAGNOSIS — I2510 Atherosclerotic heart disease of native coronary artery without angina pectoris: Secondary | ICD-10-CM | POA: Diagnosis not present

## 2024-01-19 HISTORY — DX: Unspecified osteoarthritis, unspecified site: M19.90

## 2024-01-19 HISTORY — DX: Atherosclerotic heart disease of native coronary artery without angina pectoris: I25.10

## 2024-01-19 LAB — BASIC METABOLIC PANEL WITH GFR
Anion gap: 9 (ref 5–15)
BUN: 16 mg/dL (ref 8–23)
CO2: 24 mmol/L (ref 22–32)
Calcium: 9.3 mg/dL (ref 8.9–10.3)
Chloride: 102 mmol/L (ref 98–111)
Creatinine, Ser: 0.88 mg/dL (ref 0.61–1.24)
GFR, Estimated: >60 mL/min
Glucose, Bld: 106 mg/dL — ABNORMAL HIGH (ref 70–99)
Potassium: 4.2 mmol/L (ref 3.5–5.1)
Sodium: 134 mmol/L — ABNORMAL LOW (ref 135–145)

## 2024-01-19 LAB — CBC
HCT: 41.4 % (ref 39.0–52.0)
Hemoglobin: 14 g/dL (ref 13.0–17.0)
MCH: 32.2 pg (ref 26.0–34.0)
MCHC: 33.8 g/dL (ref 30.0–36.0)
MCV: 95.2 fL (ref 80.0–100.0)
Platelets: 172 K/uL (ref 150–400)
RBC: 4.35 MIL/uL (ref 4.22–5.81)
RDW: 12.9 % (ref 11.5–15.5)
WBC: 4.3 K/uL (ref 4.0–10.5)
nRBC: 0 % (ref 0.0–0.2)

## 2024-01-19 LAB — SURGICAL PCR SCREEN
MRSA, PCR: NEGATIVE
Staphylococcus aureus: NEGATIVE

## 2024-01-20 ENCOUNTER — Encounter (HOSPITAL_COMMUNITY): Payer: Self-pay

## 2024-01-20 NOTE — Anesthesia Preprocedure Evaluation (Addendum)
 "                                  Anesthesia Evaluation  Patient identified by MRN, date of birth, ID band Patient awake    Reviewed: Allergy & Precautions, NPO status , Patient's Chart, lab work & pertinent test results  Airway Mallampati: II  TM Distance: >3 FB Neck ROM: Full    Dental  (+) Teeth Intact, Dental Advisory Given   Pulmonary neg pulmonary ROS   breath sounds clear to auscultation       Cardiovascular hypertension, Pt. on medications + CAD   Rhythm:Regular Rate:Normal     Neuro/Psych negative neurological ROS  negative psych ROS   GI/Hepatic negative GI ROS, Neg liver ROS,,,  Endo/Other  negative endocrine ROS    Renal/GU negative Renal ROS     Musculoskeletal  (+) Arthritis ,    Abdominal   Peds  Hematology negative hematology ROS (+)   Anesthesia Other Findings   Reproductive/Obstetrics                              Anesthesia Physical Anesthesia Plan  ASA: 2  Anesthesia Plan: Spinal   Post-op Pain Management: Regional block*   Induction: Intravenous  PONV Risk Score and Plan: 2 and Ondansetron , Dexamethasone , Propofol  infusion and Treatment may vary due to age or medical condition  Airway Management Planned: Natural Airway and Nasal Cannula  Additional Equipment: None  Intra-op Plan:   Post-operative Plan:   Informed Consent: I have reviewed the patients History and Physical, chart, labs and discussed the procedure including the risks, benefits and alternatives for the proposed anesthesia with the patient or authorized representative who has indicated his/her understanding and acceptance.       Plan Discussed with: CRNA  Anesthesia Plan Comments: (See PAT note from 1/6  Lab Results      Component                Value               Date                      WBC                      4.3                 01/19/2024                HGB                      14.0                01/19/2024                 HCT                      41.4                01/19/2024                MCV                      95.2                01/19/2024  PLT                      172                 01/19/2024           )         Anesthesia Quick Evaluation  "

## 2024-01-20 NOTE — Progress Notes (Signed)
 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  Patient underwent EGD on 07/31/2023 due to complaint of abdominal pain with possible foreign body in the stomach.  No anesthesia complications noted  Patient seen by Cardiology in the past for elevated calcium  score. CCTA in 2022 showed mild nonobstructive disease and he's on medical therapy.  Seen by PCP on 12/18/2023 for preop clearance.  Patient is very active without symptoms.  He was cleared as low risk:  Preoperative assessment for right total knee replacement.  His revised cardiac risk index score would be 0 which equates to 0.5% risk of major complication.  No recent angina symptoms.  He plans to get preop labs few days prior to surgery.  He knows to hold aspirin around 5 days in advance of surgery -Obtain EKG- NSR with no acute ST-T wave changes.   -no medical contraindications for surgery.    VS: BP (!) 142/95   Pulse 67   Temp 37.1 C (Oral)   Resp 16   Ht 5' 10 (1.778 m)   Wt 81.2 kg   SpO2 97%   BMI 25.68 kg/m   PROVIDERS: Shane Perkins ORN, Perkins   LABS: Labs reviewed: Acceptable for surgery. (all labs ordered are listed, but only abnormal results are displayed)  Labs Reviewed  BASIC METABOLIC PANEL WITH GFR - Abnormal; Notable for the following components:      Result Value   Sodium 134 (*)    Glucose, Bld 106 (*)    All other components within normal limits  SURGICAL PCR SCREEN  CBC     EKG 12/18/2023:  Sinus bradycardia, rate 58  CCTA 02/08/2020:  IMPRESSION: 1. Coronary calcium  score of 405. This was 57 percentile for age and sex matched  control.   2. Normal coronary origin with right dominance.   3. CAD-RADS 2. Mild non-obstructive CAD (25-49%) in the proximal LAD, minimal diffuse plaque in the RCA and LCX arteries and moderate plaque (CAD-RADS 3) in the ostial portion of a small 1. diagonal artery. Consider preventive therapy and aggressive risk factor modification.   FFR:  IMPRESSION: 1. CT FFR analysis didn't show any significant stenosis. Continue medical management.   Past Medical History:  Diagnosis Date   Arthritis    Colon polyps    History of colonic polyps 08/13/2004   Qualifier: Diagnosis of  By: Shane Perkins, Shane Perkins    Hypertension    Low testosterone     Wears contact lenses     Past Surgical History:  Procedure Laterality Date   COLONOSCOPY     ESOPHAGOGASTRODUODENOSCOPY N/A 07/31/2023   Procedure: EGD (ESOPHAGOGASTRODUODENOSCOPY);  Surgeon: Shane Perkins BRAVO, Perkins;  Location: [facility];  Service: Gastroenterology;  Laterality: N/A;   HAMMERTOE RECONSTRUCTION WITH WEIL OSTEOTOMY Right 03/16/2014   Procedure: RIGHT SECOND/THIRD METATARSAL WEIL OSTEOSTOMY AND HAMMERTOE CORRECTION;  Surgeon: Shane Armor, Perkins;  Location: [facility];  Service: Orthopedics;  Laterality: Right;  ANESTHESIA: GENERAL/REGIONAL BLOCK   PROSTATE SURGERY  2010   bx   VARIOCOCELE REPAIR  1985    MEDICATIONS:  aspirin 81 MG chewable tablet  EPINEPHrine  (EPIPEN  2-PAK) 0.3 mg/0.3 mL IJ SOAJ injection   losartan  (COZAAR ) 100 MG tablet   Multiple Vitamin (MULTIVITAMIN WITH MINERALS) TABS tablet   naproxen sodium (ALEVE) 220 MG tablet   REPATHA  SURECLICK 140 MG/ML SOAJ   rosuvastatin  (CRESTOR ) 40 MG tablet   Testosterone  1.62 % GEL   No current facility-administered medications for this encounter.    Shane Perkins Shane Perkins Surgical Short Stay/Anesthesiology [facility] Phone [phone] 01/20/2024 2:13 PM       "

## 2024-01-27 ENCOUNTER — Other Ambulatory Visit (HOSPITAL_COMMUNITY): Payer: Self-pay

## 2024-01-27 MED ORDER — POLYETHYLENE GLYCOL 3350 17 G PO PACK: 17.0000 g | PACK | Freq: Two times a day (BID) | ORAL | 0 refills | Status: AC

## 2024-01-27 MED ORDER — CYCLOBENZAPRINE HCL 5 MG PO TABS: 5.0000 mg | ORAL_TABLET | Freq: Three times a day (TID) | ORAL | 2 refills | Status: AC | PRN

## 2024-01-27 MED ORDER — ASPIRIN 81 MG PO CHEW: 81.0000 mg | CHEWABLE_TABLET | Freq: Two times a day (BID) | ORAL | 0 refills | Status: AC

## 2024-01-27 MED ORDER — SENNA 8.6 MG PO TABS: 17.2000 mg | ORAL_TABLET | Freq: Every evening | ORAL | 0 refills | Status: AC

## 2024-01-28 ENCOUNTER — Ambulatory Visit (HOSPITAL_COMMUNITY)
Admission: RE | Admit: 2024-01-28 | Discharge: 2024-01-28 | Disposition: A | Discharge status: Home / Self Care | Source: Ambulatory Visit | Attending: Orthopedic Surgery | Admitting: Orthopedic Surgery

## 2024-01-28 ENCOUNTER — Encounter (HOSPITAL_COMMUNITY)
Admission: RE | Disposition: A | Discharge status: Home / Self Care | Payer: Self-pay | Source: Ambulatory Visit | Attending: Orthopedic Surgery

## 2024-01-28 ENCOUNTER — Encounter (HOSPITAL_COMMUNITY): Payer: Self-pay | Admitting: Orthopedic Surgery

## 2024-01-28 ENCOUNTER — Ambulatory Visit (HOSPITAL_COMMUNITY): Admitting: Anesthesiology

## 2024-01-28 ENCOUNTER — Ambulatory Visit (HOSPITAL_COMMUNITY): Payer: Self-pay | Admitting: Medical

## 2024-01-28 DIAGNOSIS — M1711 Unilateral primary osteoarthritis, right knee: Secondary | ICD-10-CM

## 2024-01-28 DIAGNOSIS — Z7982 Long term (current) use of aspirin: Secondary | ICD-10-CM | POA: Diagnosis not present

## 2024-01-28 DIAGNOSIS — Z8249 Family history of ischemic heart disease and other diseases of the circulatory system: Secondary | ICD-10-CM | POA: Insufficient documentation

## 2024-01-28 DIAGNOSIS — Z96651 Presence of right artificial knee joint: Secondary | ICD-10-CM

## 2024-01-28 DIAGNOSIS — M25761 Osteophyte, right knee: Secondary | ICD-10-CM | POA: Diagnosis not present

## 2024-01-28 DIAGNOSIS — M65961 Unspecified synovitis and tenosynovitis, right lower leg: Secondary | ICD-10-CM | POA: Insufficient documentation

## 2024-01-28 DIAGNOSIS — I2510 Atherosclerotic heart disease of native coronary artery without angina pectoris: Secondary | ICD-10-CM | POA: Diagnosis not present

## 2024-01-28 DIAGNOSIS — Z79899 Other long term (current) drug therapy: Secondary | ICD-10-CM | POA: Diagnosis not present

## 2024-01-28 DIAGNOSIS — I10 Essential (primary) hypertension: Secondary | ICD-10-CM | POA: Diagnosis not present

## 2024-01-28 HISTORY — PX: TOTAL KNEE ARTHROPLASTY: SHX125

## 2024-01-28 MED ORDER — FENTANYL CITRATE (PF) 100 MCG/2ML IJ SOLN
INTRAMUSCULAR | Status: AC
  Filled 2024-01-28: qty 2

## 2024-01-28 MED ORDER — OXYCODONE HCL 5 MG PO TABS
5.0000 mg | ORAL_TABLET | ORAL | Status: DC | PRN
  Administered 2024-01-28: 5 mg via ORAL

## 2024-01-28 MED ORDER — METOCLOPRAMIDE HCL 5 MG PO TABS: 5.0000 mg | ORAL_TABLET | Freq: Three times a day (TID) | ORAL | Status: DC | PRN

## 2024-01-28 MED ORDER — SODIUM CHLORIDE 0.9 % IR SOLN
Status: DC | PRN
  Administered 2024-01-28: 1000 mL

## 2024-01-28 MED ORDER — FENTANYL CITRATE (PF) 100 MCG/2ML IJ SOLN
INTRAMUSCULAR | Status: DC | PRN
  Administered 2024-01-28 (×4): 25 ug via INTRAVENOUS

## 2024-01-28 MED ORDER — DROPERIDOL 2.5 MG/ML IJ SOLN: 0.6250 mg | Freq: Once | INTRAMUSCULAR | Status: DC | PRN

## 2024-01-28 MED ORDER — ACETAMINOPHEN 500 MG PO TABS: 1000.0000 mg | ORAL_TABLET | Freq: Four times a day (QID) | ORAL | Status: DC

## 2024-01-28 MED ORDER — LACTATED RINGERS IV SOLN: INTRAVENOUS | Status: DC

## 2024-01-28 MED ORDER — SODIUM CHLORIDE (PF) 0.9 % IJ SOLN
INTRAMUSCULAR | Status: DC | PRN
  Administered 2024-01-28: 61 mL

## 2024-01-28 MED ORDER — MIDAZOLAM HCL 2 MG/2ML IJ SOLN
INTRAMUSCULAR | Status: AC
  Filled 2024-01-28: qty 2

## 2024-01-28 MED ORDER — STERILE WATER FOR IRRIGATION IR SOLN
Status: DC | PRN
  Administered 2024-01-28: 2000 mL

## 2024-01-28 MED ORDER — SODIUM CHLORIDE (PF) 0.9 % IJ SOLN
INTRAMUSCULAR | Status: AC
  Filled 2024-01-28: qty 30

## 2024-01-28 MED ORDER — PROPOFOL 1000 MG/100ML IV EMUL
INTRAVENOUS | Status: AC
  Filled 2024-01-28: qty 100

## 2024-01-28 MED ORDER — ONDANSETRON HCL 4 MG/2ML IJ SOLN: 4.0000 mg | Freq: Four times a day (QID) | INTRAMUSCULAR | Status: DC | PRN

## 2024-01-28 MED ORDER — DEXAMETHASONE SOD PHOSPHATE PF 10 MG/ML IJ SOLN
INTRAMUSCULAR | Status: AC
  Filled 2024-01-28: qty 1

## 2024-01-28 MED ORDER — MEPERIDINE HCL 25 MG/ML IJ SOLN: 6.2500 mg | INTRAMUSCULAR | Status: DC | PRN

## 2024-01-28 MED ORDER — HYDROMORPHONE HCL 1 MG/ML IJ SOLN: 0.5000 mg | INTRAMUSCULAR | Status: DC | PRN

## 2024-01-28 MED ORDER — POVIDONE-IODINE 10 % EX SWAB
2.0000 | Freq: Once | CUTANEOUS | Status: AC
  Administered 2024-01-28: 2 via TOPICAL

## 2024-01-28 MED ORDER — OXYCODONE HCL 5 MG PO TABS
ORAL_TABLET | ORAL | Status: AC
  Filled 2024-01-28: qty 1

## 2024-01-28 MED ORDER — LACTATED RINGERS IV BOLUS: 500.0000 mL | Freq: Once | INTRAVENOUS | Status: DC

## 2024-01-28 MED ORDER — OXYCODONE HCL 5 MG PO TABS: 10.0000 mg | ORAL_TABLET | ORAL | Status: DC | PRN

## 2024-01-28 MED ORDER — HYDROMORPHONE HCL 1 MG/ML IJ SOLN: 0.2500 mg | INTRAMUSCULAR | Status: DC | PRN

## 2024-01-28 MED ORDER — CYCLOBENZAPRINE HCL 5 MG PO TABS: 5.0000 mg | ORAL_TABLET | Freq: Three times a day (TID) | ORAL | Status: DC | PRN

## 2024-01-28 MED ORDER — MEPIVACAINE HCL (PF) 2 % IJ SOLN
INTRAMUSCULAR | Status: DC | PRN
  Administered 2024-01-28: 3 mL via INTRATHECAL

## 2024-01-28 MED ORDER — CEFAZOLIN SODIUM-DEXTROSE 2-4 GM/100ML-% IV SOLN
2.0000 g | INTRAVENOUS | Status: AC
  Administered 2024-01-28: 2 g via INTRAVENOUS
  Filled 2024-01-28: qty 100

## 2024-01-28 MED ORDER — MIDAZOLAM HCL (PF) 2 MG/2ML IJ SOLN
INTRAMUSCULAR | Status: DC | PRN
  Administered 2024-01-28: 2 mg via INTRAVENOUS

## 2024-01-28 MED ORDER — ORAL CARE MOUTH RINSE: 15.0000 mL | Freq: Once | OROMUCOSAL | Status: AC

## 2024-01-28 MED ORDER — TRANEXAMIC ACID-NACL 1000-0.7 MG/100ML-% IV SOLN
1000.0000 mg | INTRAVENOUS | Status: AC
  Administered 2024-01-28: 1000 mg via INTRAVENOUS
  Filled 2024-01-28: qty 100

## 2024-01-28 MED ORDER — PHENYLEPHRINE HCL-NACL 20-0.9 MG/250ML-% IV SOLN
INTRAVENOUS | Status: DC | PRN
  Administered 2024-01-28: 20 ug/min via INTRAVENOUS

## 2024-01-28 MED ORDER — METHOCARBAMOL 500 MG PO TABS
ORAL_TABLET | ORAL | Status: AC
  Filled 2024-01-28: qty 1

## 2024-01-28 MED ORDER — 0.9 % SODIUM CHLORIDE (POUR BTL) OPTIME
TOPICAL | Status: DC | PRN
  Administered 2024-01-28: 1000 mL

## 2024-01-28 MED ORDER — DEXAMETHASONE SOD PHOSPHATE PF 10 MG/ML IJ SOLN
8.0000 mg | Freq: Once | INTRAMUSCULAR | Status: AC
  Administered 2024-01-28: 8 mg via INTRAVENOUS

## 2024-01-28 MED ORDER — BUPIVACAINE-EPINEPHRINE (PF) 0.25% -1:200000 IJ SOLN
INTRAMUSCULAR | Status: AC
  Filled 2024-01-28: qty 30

## 2024-01-28 MED ORDER — CHLORHEXIDINE GLUCONATE 0.12 % MT SOLN
15.0000 mL | Freq: Once | OROMUCOSAL | Status: AC
  Administered 2024-01-28: 15 mL via OROMUCOSAL

## 2024-01-28 MED ORDER — ONDANSETRON HCL 4 MG/2ML IJ SOLN
INTRAMUSCULAR | Status: AC
  Filled 2024-01-28: qty 2

## 2024-01-28 MED ORDER — PROPOFOL 500 MG/50ML IV EMUL
INTRAVENOUS | Status: DC | PRN
  Administered 2024-01-28: 25 ug/kg/min via INTRAVENOUS

## 2024-01-28 MED ORDER — ACETAMINOPHEN 10 MG/ML IV SOLN: 1000.0000 mg | Freq: Once | INTRAVENOUS | Status: DC | PRN

## 2024-01-28 MED ORDER — KETOROLAC TROMETHAMINE 30 MG/ML IJ SOLN
INTRAMUSCULAR | Status: AC
  Filled 2024-01-28: qty 1

## 2024-01-28 MED ORDER — OXYCODONE HCL 5 MG/5ML PO SOLN: 5.0000 mg | Freq: Once | ORAL | Status: AC | PRN

## 2024-01-28 MED ORDER — OXYCODONE HCL 5 MG PO TABS
5.0000 mg | ORAL_TABLET | Freq: Once | ORAL | Status: AC | PRN
  Administered 2024-01-28: 5 mg via ORAL

## 2024-01-28 MED ORDER — ONDANSETRON HCL 4 MG PO TABS: 4.0000 mg | ORAL_TABLET | Freq: Four times a day (QID) | ORAL | Status: DC | PRN

## 2024-01-28 MED ORDER — METOCLOPRAMIDE HCL 5 MG/ML IJ SOLN: 5.0000 mg | Freq: Three times a day (TID) | INTRAMUSCULAR | Status: DC | PRN

## 2024-01-28 MED ORDER — CEFAZOLIN SODIUM-DEXTROSE 2-4 GM/100ML-% IV SOLN: 2.0000 g | Freq: Four times a day (QID) | INTRAVENOUS | Status: DC

## 2024-01-28 MED ORDER — ONDANSETRON HCL 4 MG/2ML IJ SOLN
INTRAMUSCULAR | Status: DC | PRN
  Administered 2024-01-28: 4 mg via INTRAVENOUS

## 2024-01-28 MED ORDER — ROPIVACAINE HCL 5 MG/ML IJ SOLN
INTRAMUSCULAR | Status: DC | PRN
  Administered 2024-01-28: 20 mL via PERINEURAL

## 2024-01-28 MED ORDER — PROPOFOL 10 MG/ML IV BOLUS
INTRAVENOUS | Status: AC
  Filled 2024-01-28: qty 20

## 2024-01-28 MED ORDER — TRANEXAMIC ACID-NACL 1000-0.7 MG/100ML-% IV SOLN: 1000.0000 mg | Freq: Once | INTRAVENOUS | Status: DC

## 2024-01-28 NOTE — Anesthesia Postprocedure Evaluation (Signed)
"   Anesthesia Post Note  Patient: [name], MD  Procedure(s) Performed: ARTHROPLASTY, KNEE, TOTAL (Right: Knee)     Patient location during evaluation: PACU Anesthesia Type: Spinal Level of consciousness: oriented and awake and alert Pain management: pain level controlled Vital Signs Assessment: post-procedure vital signs reviewed and stable Respiratory status: spontaneous breathing, respiratory function stable and patient connected to nasal cannula oxygen Cardiovascular status: blood pressure returned to baseline and stable Postop Assessment: no headache, no backache and no apparent nausea or vomiting Anesthetic complications: no   No notable events documented.  Last Vitals:  Vitals:   01/28/24 1100 01/28/24 1130  BP: (!) 128/97 (!) 154/95  Pulse: 73 73  Resp: 16   Temp: (!) 36.4 C   SpO2: 100% 99%    Last Pain:  Vitals:   01/28/24 1300  TempSrc:   PainSc: 3                  [name]      "

## 2024-01-28 NOTE — Anesthesia Procedure Notes (Signed)
 Anesthesia Regional Block: Adductor canal block   Pre-Anesthetic Checklist: , timeout performed,  Correct Patient, Correct Site, Correct Laterality,  Correct Procedure, Correct Position, site marked,  Risks and benefits discussed,  Surgical consent,  Pre-op evaluation,  At surgeon's request and post-op pain management  Laterality: Right  Prep: chloraprep       Needles:  Injection technique: Single-shot  Needle Type: Echogenic Stimulator Needle     Needle Length: 9cm  Needle Gauge: 21     Additional Needles:   Procedures:,,,, ultrasound used (permanent image in chart),,    Narrative:  Start time: 01/28/2024 7:00 AM End time: 01/28/2024 7:05 AM Injection made incrementally with aspirations every 5 mL.  Performed by: Personally  Anesthesiologist: Tilford Franky BIRCH, MD  Additional Notes: Discussed risks and benefits of the nerve block in detail, including but not limited vascular injury, permanent nerve damage and infection.   Patient tolerated the procedure well. Local anesthetic introduced in an incremental fashion under minimal resistance after negative aspirations. No paresthesias were elicited. After completion of the procedure, no acute issues were identified and patient continued to be monitored by RN.

## 2024-01-28 NOTE — Evaluation (Signed)
 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  Pt should progress to dc home with family assist and reports first OP PT scheduled for 02/01/24.  THis date, pt performed HEP with written instruction provided and reviewed, ambulated in hall, negotiated stairs in multiple configurations, stood to urinate with increased time but no assist to balance and with multiple questions asked and answered. Pt and spouse state comfortable with dc home this date.      If plan is discharge home, recommend the following: A little help with walking and/or transfers;A little help with bathing/dressing/bathroom;Assistance with cooking/housework;Assist for transportation;Help with stairs or ramp for entrance   Can travel by private vehicle        Equipment Recommendations Rolling walker (2 wheels)  Recommendations for Other Services       Functional Status Assessment Patient has had a recent decline in their functional status and demonstrates the ability to make significant improvements in function in a reasonable and predictable amount of time.     Precautions / Restrictions Precautions Precautions: Knee;Fall Restrictions Weight Bearing Restrictions Per Provider Order: No      Mobility  Bed Mobility Overal bed mobility: Needs Assistance Bed Mobility: Supine to Sit, Sit to Supine     Supine to sit: Supervision Sit to supine: Contact guard assist   General bed mobility comments: for safety    Transfers Overall transfer level: Needs assistance Equipment used: Rolling walker (2 wheels) Transfers: Sit to/from Stand Sit to Stand: Contact guard assist, Supervision           General transfer comment: cues for LE management and use of UEs to self  assist    Ambulation/Gait Ambulation/Gait assistance: Min assist, Contact guard assist Gait Distance (Feet): 75 Feet Assistive device: Rolling walker (2 wheels) Gait Pattern/deviations: Step-to pattern, Decreased step length - right, Decreased step length - left, Shuffle, Trunk flexed Gait velocity: decr     General Gait Details: Steady assist with cues for sequence, posture and position from RW  Stairs Stairs: Yes Stairs assistance: Min assist Stair Management: No rails, Step to pattern, Backwards, Forwards, With walker, With crutches Number of Stairs: 5 General stair comments: single step with RW - fwd and bkwd; 3 steps with bil crutches; cues for sequence  Wheelchair Mobility     Tilt Bed    Modified Rankin (Stroke Patients Only)       Balance Overall balance assessment: Needs assistance Sitting-balance support: No upper extremity supported, Feet supported Sitting balance-Leahy Scale: Good     Standing balance support: No upper extremity supported Standing balance-Leahy Scale: Fair Standing balance comment: Pt managed urinal in standing unassisted                             Pertinent Vitals/Pain Pain Assessment Pain Assessment: 0-10 Pain Score: 4  Pain Location: R knee Pain Descriptors / Indicators: Aching, Sore Pain Intervention(s): Limited activity within patient's tolerance, Monitored during session, Premedicated before session    Home Living Family/patient expects to be discharged to:: Private residence Living Arrangements: Spouse/significant other Available Help at Discharge: Family Type of Home: House Home Access: Stairs to enter Entrance Stairs-Rails: None Entrance Stairs-Number of Steps: 2 Alternate Level Stairs-Number of Steps: 1  Home Layout: Two level Home Equipment: Crutches      Prior Function Prior Level of Function : Independent/Modified Independent                     Extremity/Trunk Assessment   Upper Extremity  Assessment Upper Extremity Assessment: Overall WFL for tasks assessed    Lower Extremity Assessment Lower Extremity Assessment: RLE deficits/detail RLE Deficits / Details: AAROM at knee -5 - 60 with IND SLR    Cervical / Trunk Assessment Cervical / Trunk Assessment: Normal  Communication   Communication Communication: No apparent difficulties    Cognition Arousal: Alert Behavior During Therapy: WFL for tasks assessed/performed   PT - Cognitive impairments: No apparent impairments                       PT - Cognition Comments: Some delayed processing noted - ?meds Following commands: Impaired Following commands impaired: Follows one step commands with increased time, Follows multi-step commands with increased time     Cueing Cueing Techniques: Verbal cues, Gestural cues     General Comments      Exercises Total Joint Exercises Ankle Circles/Pumps: AROM, Both, 15 reps, Supine Quad Sets: AAROM, Both, 10 reps, Supine Heel Slides: AAROM, Right, 15 reps, Supine Straight Leg Raises: AAROM, AROM, Right, 15 reps, Supine Long Arc Quad: AAROM, Right, 10 reps, Seated   Assessment/Plan    PT Assessment Patient needs continued PT services  PT Problem List Decreased strength;Decreased range of motion;Decreased activity tolerance;Decreased balance;Decreased mobility;Decreased knowledge of use of DME;Pain       PT Treatment Interventions DME instruction;Gait training;Stair training;Functional mobility training;Therapeutic activities;Balance training;Therapeutic exercise;Patient/family education    PT Goals (Current goals can be found in the Care Plan section)  Acute Rehab PT Goals Patient Stated Goal: Rumba again PT Goal Formulation: With patient Time For Goal Achievement: 02/04/24 Potential to Achieve Goals: Good    Frequency 7X/week     Co-evaluation               AM-PAC PT 6 Clicks Mobility  Outcome Measure Help needed turning from your back to your  side while in a flat bed without using bedrails?: A Little Help needed moving from lying on your back to sitting on the side of a flat bed without using bedrails?: A Little Help needed moving to and from a bed to a chair (including a wheelchair)?: A Little Help needed standing up from a chair using your arms (e.g., wheelchair or bedside chair)?: A Little Help needed to walk in hospital room?: A Little Help needed climbing 3-5 steps with a railing? : A Little 6 Click Score: 18    End of Session Equipment Utilized During Treatment: Gait belt Activity Tolerance: Patient tolerated treatment well Patient left: in bed;with call bell/phone within reach;with family/visitor present Nurse Communication: Mobility status PT Visit Diagnosis: Difficulty in walking, not elsewhere classified (R26.2);Pain Pain - Right/Left: Right Pain - part of body: Knee    Time: 1337-1450 PT Time Calculation (min) (ACUTE ONLY): 73 min   Charges:   PT Evaluation $PT Eval Low Complexity: 1 Low PT Treatments $Gait Training: 8-22 mins $Therapeutic Exercise: 8-22 mins $Therapeutic Activity: 8-22 mins PT General Charges $$ ACUTE PT VISIT: 1 Visit         [facility] PT Acute Rehabilitation Services Office [phone]   Alba Perillo 01/28/2024, 3:17 PM

## 2024-01-28 NOTE — Care Plan (Signed)
 Ortho Bundle Case Management Note  Patient Details  Name: Shane GEISEN, Shane Perkins MRN: [id number] Date of Birth: [birth year]  R TKA on 01/28/24  DCP: Home with wife  DME: RW; ordered through [employer]  PT: EO                  DME Arranged:  Walker rolling DME Agency:  [employer]  HH Arranged:    HH Agency:     Additional Comments: Please contact me with any questions of if this plan should need to change.  Lyle Pepper, CCM [facility] [phone]  Ext. [phone]   01/28/2024, 8:13 AM

## 2024-01-28 NOTE — Op Note (Signed)
 " NAME:  Shane JONETTA Como, MD                      MEDICAL RECORD NO.:  [id number]                             FACILITY:  [facility]      PHYSICIAN:  Donnice JONETTA. Ernie, M.D.  DATE OF BIRTH:  08/17/1950      DATE OF PROCEDURE:  01/28/2024                                     OPERATIVE REPORT         PREOPERATIVE DIAGNOSIS:  right knee osteoarthritis.      POSTOPERATIVE DIAGNOSIS:  right knee osteoarthritis.      FINDINGS:  The patient was noted to have complete loss of cartilage and   bone-on-bone arthritis with associated osteophytes in the lateral and patellofemoral compartments of   the knee with a significant synovitis and associated effusion.  The patient had failed months of conservative treatment including medications, injection therapy, activity modification.     PROCEDURE:  right total knee replacement.      COMPONENTS USED:  DePuy Attune fixed bearing cruciate retaining medial stabilized knee   system, a size 5 femur, 7 tibia, size 7 mm CR MS AOX insert, and 41 anatomic patellar   button.      SURGEON:  Donnice JONETTA. Ernie, M.D.      ASSISTANT:  Rosina Calin, PA-C.      ANESTHESIA:  Regional and Spinal.      SPECIMENS:  None.      COMPLICATION:  None.      DRAINS:  None.  EBL: 450      TOURNIQUET TIME:  no tourniquet was used     The patient was stable to the recovery room.      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  The patient had been seen, evaluated, and treated for months conservatively in the   office with medication, activity modification, and injections.  The patient had   radiographic changes of bone-on-bone arthritis with endplate sclerosis and osteophytes noted.  Based on the radiographic changes and failed conservative measures, the patient   decided to proceed with definitive treatment, total knee replacement.  Risks of infection, DVT, component failure, need for revision surgery, neurovascular injury were  reviewed in the office setting.  The postop course was reviewed stressing the efforts to maximize post-operative satisfaction and function.  Consent was obtained for benefit of pain   relief.      PROCEDURE IN DETAIL:  The patient was brought to the operative theater.   Once adequate anesthesia, preoperative antibiotics, 2 gm of Ancef ,1 gm of Tranexamic Acid , and 10 mg of Decadron  administered, the patient was positioned supine with bony prominences padded and protected.  The right lower extremity was prepped and draped in sterile fashion.  A time-   out was performed identifying the patient, planned procedure, and the appropriate extremity.      The right lower extremity was placed in the [facility] leg holder.  A midline incision was   made followed by median parapatellar arthrotomy.  He was noted to have significant hyperemic synovitis with a very large effusion.  Following initial  exposure, attention was first directed to the patella.  Precut   measurement was noted to be 27 mm.  I resected down to 15 mm and used a   41 anatomic patellar button to restore patellar height as well as cover the cut surface.  A limited lateral partial facetectomy was performed.     The lug holes were drilled and a metal shim was placed to protect the   patella from retractors and saw blade during the procedure.      At this point, attention was now directed to the femur.  The femoral   canal was opened with a drill, irrigated to try to prevent fat emboli.  An   intramedullary rod was passed at 5 degrees valgus, 8 mm of bone was   resected off the distal femur.  Following this resection, the tibia was   subluxated anteriorly.  Using the extramedullary guide, 2 mm of bone was resected off   the proximal lateral tibia.  We confirmed the gap would be   stable medially and laterally with a size 5 spacer block as well as confirmed that the tibial cut was perpendicular in the coronal plane, checking with an alignment  rod.      Once this was done, I sized the femur to be a size 5 in the anterior-   posterior dimension, chose a standard component based on medial and   lateral dimension.  The size 5 rotation block was then pinned in   position anterior referenced using the C-clamp to set rotation.  The   anterior, posterior, and  chamfer cuts were made without difficulty nor   notching making certain that I was along the anterior cortex to help   with flexion gap stability.      The final femoral shim cut was made off the lateral aspect of distal femur.      At this point, the tibia was sized to be a size 7.  The size 7 tray was   then pinned in position through the medial third of the tubercle,   drilled, and keel punched.  Trial reduction was now carried with a 5 femur,  7 tibia, a size 7 mm CR insert, and the 41 anatomic patella botton.  The knee was brought to full extension with good flexion stability with the patella   tracking through the trochlea without application of pressure.  Given   all these findings the trial components removed.  Final components were   opened and cement was mixed.  The knee was irrigated with normal saline solution and pulse lavage.  The posterior synovial capsule was then injected with 30 cc of 0.25% Marcaine  with epinephrine , 1 cc of Toradol  and 30 cc of NS for a total of 61 cc.     Final implants were then cemented onto cleaned and dried cut surfaces of bone with the knee brought to extension with a size 7 mm CR trial insert.      Once the cement had fully cured, excess cement was removed   throughout the knee.  I confirmed that I was satisfied with the range of   motion and stability, and the final size 7 mm CR MS AOX insert was chosen.  It was   placed into the knee.      At this point in the case there was no significant   hemostasis was required.  The extensor mechanism was then reapproximated using #1 Vicryl and #1 Stratafix sutures with  the knee   in flexion.   The   remaining wound was closed with 2-0 Vicryl and running 4-0 Monocryl.   The knee was cleaned, dried, dressed sterilely using Dermabond and   Aquacel dressing.  The patient was then   brought to recovery room in stable condition, tolerating the procedure   well.   Please note that PA Patti was present for the entirety of the case, and was utilized for pre-operative positioning, peri-operative retractor management, general facilitation of the procedure and for primary wound closure at the end of the case.              Donnice CORDOBA Ernie, M.D.    01/28/2024 8:40 AM "

## 2024-01-28 NOTE — Transfer of Care (Signed)
 Immediate Anesthesia Transfer of Care Note  Patient: Shane JONETTA Como, MD  Procedure(s) Performed: ARTHROPLASTY, KNEE, TOTAL (Right: Knee)  Patient Location: PACU  Anesthesia Type:MAC  Level of Consciousness: oriented and patient cooperative  Airway & Oxygen Therapy: Patient Spontanous Breathing and Patient connected to face mask oxygen  Post-op Assessment: Report given to RN and Post -op Vital signs reviewed and stable  Post vital signs: Reviewed and stable  Last Vitals:  Vitals Value Taken Time  BP 124/78 01/28/24 09:00  Temp    Pulse 68 01/28/24 09:02  Resp 10 01/28/24 09:02  SpO2 99 % 01/28/24 09:02  Vitals shown include unfiled device data.  Last Pain:  Vitals:   01/28/24 0609  TempSrc:   PainSc: 0-No pain         Complications: No notable events documented.

## 2024-01-28 NOTE — H&P (Signed)
 TOTAL KNEE ADMISSION H&P  Patient is being admitted for right total knee arthroplasty.  Therapy Plans: outpatient therapy at [facility] Disposition: Home with wife Planned DVT Prophylaxis: aspirin  81mg  BID DME needed: walker PCP: Dr. 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 Onset of symptoms was gradual, starting 2 years ago with gradually worsening course since that time. The patient noted no past surgery on the right  knee.  Patient currently rates pain in the right knee at 2 out of 10 with activity. Patient has worsening of pain with activity and weight bearing and pain that interferes with activities of daily living. Patient has evidence of joint space narrowing by imaging studies. There is no active infection.  Patient Active Problem List   Diagnosis Date Noted   Abdominal pain, epigastric 07/31/2023   Abnormal CT scan, stomach 07/31/2023   Osteoarthritis of right knee 08/23/2020   Low testosterone  06/02/2012   History of elevated PSA 06/02/2012   PERSONAL HISTORY OF OTHER SPECIFIED DISEASES 04/27/2009   HYPERTENSION 09/10/2007   HEMORRHOIDS, INTERNAL 09/10/2007   HIATAL HERNIA 08/13/2004   History of colonic polyps 08/13/2004    Past Medical History:  Diagnosis Date   Arthritis    Colon polyps    Coronary artery calcification seen on CT scan    History of colonic polyps 08/13/2004   Qualifier:  Diagnosis of  By: Avram MD, NOLIA Pitts E    Hypertension    Low testosterone     Wears contact lenses     Past Surgical History:  Procedure Laterality Date   COLONOSCOPY     ESOPHAGOGASTRODUODENOSCOPY N/A 07/31/2023   Procedure: EGD (ESOPHAGOGASTRODUODENOSCOPY);  Surgeon: Avram Pitts BRAVO, MD;  Location: [facility];  Service: Gastroenterology;  Laterality: N/A;   HAMMERTOE RECONSTRUCTION WITH WEIL OSTEOTOMY Right 03/16/2014   Procedure: RIGHT SECOND/THIRD METATARSAL WEIL OSTEOSTOMY AND HAMMERTOE CORRECTION;  Surgeon: Norleen Armor, MD;  Location: [facility];  Service: Orthopedics;  Laterality: Right;  ANESTHESIA: GENERAL/REGIONAL BLOCK   PROSTATE SURGERY  2010   bx   VARIOCOCELE REPAIR  1985    Prior to Admission medications  Medication Sig Start Date End Date Taking? Authorizing Provider  aspirin  81 MG chewable tablet Chew 81 mg by mouth daily.   Yes [provider]  cyclobenzaprine  (FLEXERIL ) 5 MG tablet Take 1-2 tablets (5-10 mg total) by mouth every 8 (eight) hours as needed for muscle pain or spasm. 12/30/23  Yes   EPINEPHrine  (EPIPEN  2-PAK) 0.3 mg/0.3 mL IJ SOAJ injection Inject 0.3 mg into the muscle as needed for anaphylaxis. 02/13/22  Yes Burchette, Wolm ORN, MD  losartan  (COZAAR ) 100 MG tablet Take 1 tablet (100 mg total) by mouth daily. 12/21/23  Yes Burchette, Wolm ORN, MD  Multiple Vitamin (MULTIVITAMIN WITH MINERALS) TABS tablet Take 1 tablet by mouth every other day.   Yes [provider]  naproxen sodium (ALEVE) 220 MG tablet Take 220-440 mg by mouth 2 (two) times daily as needed (pain.).  Yes [provider]  REPATHA  SURECLICK 140 MG/ML SOAJ ADMINISTER 1 ML UNDER THE SKIN EVERY 14 DAYS 03/09/23  Yes Burchette, Wolm ORN, MD  rosuvastatin  (CRESTOR ) 40 MG tablet Take 0.5 tablets (20 mg total) by mouth every other day. 07/31/23  Yes Avram Lupita BRAVO, MD  Testosterone  1.62 % GEL APPLY 1 PUMP TOPICALLY TO EACH ARM EVERY DAY AS DIRECTED 12/21/23   Yes Burchette, Wolm ORN, MD  aspirin  81 MG chewable tablet Chew and swallow 1 tablet (81 mg total) by mouth 2 (two) times daily for four weeks. 12/30/23     polyethylene glycol (MIRALAX  / GLYCOLAX ) 17 g packet Mix and drink 1 packet by mouth twice daily for 14 days. 12/30/23     senna (SENOKOT) 8.6 MG TABS tablet Take 2 tablets (17.2 mg total) by mouth at bedtime for 14 days. 12/30/23       Allergies[1]  Social History   Socioeconomic History   Marital status: Married    Spouse name: Not on file   Number of children: 3   Years of education: Not on file   Highest education level: Professional school degree (e.g., MD, DDS, DVM, JD)  Occupational History   Not on file  Tobacco Use   Smoking status: Never   Smokeless tobacco: Never  Vaping Use   Vaping status: Never Used  Substance and Sexual Activity   Alcohol use: Yes    Comment: occ. 2 glasses wine month per pt   Drug use: No   Sexual activity: Not on file  Other Topics Concern   Not on file  Social History Narrative   Not on file   Social Drivers of Health   Tobacco Use: Low Risk (01/28/2024)   Patient History    Smoking Tobacco Use: Never    Smokeless Tobacco Use: Never    Passive Exposure: Not on file  Financial Resource Strain: Low Risk (07/27/2023)   Overall Financial Resource Strain (CARDIA)    Difficulty of Paying Living Expenses: Not hard at all  Food Insecurity: No Food Insecurity (07/27/2023)   Epic    Worried About Programme Researcher, Broadcasting/film/video in the Last Year: Never true    Ran Out of Food in the Last Year: Never true  Transportation Needs: No Transportation Needs (07/27/2023)   Epic    Lack of Transportation (Medical): No    Lack of Transportation (Non-Medical): No  Physical Activity: Sufficiently Active (07/27/2023)   Exercise Vital Sign    Days of Exercise per Week: 6 days    Minutes of Exercise per Session: 80 min  Stress: No Stress Concern Present (07/27/2023)   [employer] of Occupational Health -  Occupational Stress Questionnaire    Feeling of Stress: Not at all  Social Connections: Socially Integrated (07/27/2023)   Social Connection and Isolation Panel    Frequency of Communication with Friends and Family: Three times a week    Frequency of Social Gatherings with Friends and Family: Once a week    Attends Religious Services: More than 4 times per year    Active Member of Clubs or Organizations: Yes    Attends Banker Meetings: More than 4 times per year    Marital Status: Married  Catering Manager Violence: Not on file  Depression (PHQ2-9): Low Risk (07/27/2023)   Depression (PHQ2-9)    PHQ-2 Score: 0  Alcohol Screen: Low Risk (07/27/2023)   Alcohol Screen    Last Alcohol Screening Score (AUDIT): 2  Housing: Low Risk (07/27/2023)  Epic    Unable to Pay for Housing in the Last Year: No    Number of Times Moved in the Last Year: 0    Homeless in the Last Year: No  Utilities: Not on file  Health Literacy: Not on file    Tobacco Use: Low Risk (01/28/2024)   Patient History    Smoking Tobacco Use: Never    Smokeless Tobacco Use: Never    Passive Exposure: Not on file   Social History   Substance and Sexual Activity  Alcohol Use Yes   Comment: occ. 2 glasses wine month per pt    Family History  Problem Relation Age of Onset   Hypertension Mother    Colon polyps Brother    Colon cancer Paternal Uncle        possible.  not sure   Esophageal cancer Neg Hx    Pancreatic cancer Neg Hx    Prostate cancer Neg Hx    Rectal cancer Neg Hx    Stomach cancer Neg Hx     Review Of Systems: Constitutional: Constitutional: no fever, chills, night sweats, or significant weight loss. Cardiovascular: Cardiovascular: no palpitations or chest pain. Respiratory: Respiratory: no cough or shortness of breath and No COPD. Gastrointestinal: Gastrointestinal: no vomiting or nausea. Musculoskeletal: Musculoskeletal: Joint Pain and swelling in Joints. Neurologic: Neurologic:  no numbness, tingling, or difficulty with balance.  Objective:  Physical Exam: Right knee exam: No significant knee effusion Slight flexion contracture associated with his valgus right knee Passively correctable valgus Tenderness laterally Flexion to 120 degrees with tightness and mild crepitation No significant lower extremity edema, erythema or calf tenderness Left knee exam: No palpable effusion More neutral alignment without significant flexion contracture with flexion 120 degrees  Vital signs in last 24 hours: Temp:  [97.7 F (36.5 C)] 97.7 F (36.5 C) (01/15 0558) Pulse Rate:  [73] 73 (01/15 0558) Resp:  [16] 16 (01/15 0558) BP: (146)/(93) 146/93 (01/15 0558) SpO2:  [97 %] 97 % (01/15 0558) Weight:  [81.2 kg] 81.2 kg (01/15 0609)  Imaging Review Plain radiographs demonstrate severe degenerative joint disease of the right knee.  The bone quality appears to be adequate for age and reported activity level.  Assessment/Plan:  End stage arthritis, right knee   The patient history, physical examination, clinical judgment of the provider and imaging studies are consistent with end stage degenerative joint disease of the right knee and total knee arthroplasty is deemed medically necessary. The treatment options including medical management, injection therapy arthroscopy and arthroplasty were discussed at length. The risks and benefits of total knee arthroplasty were presented and reviewed. The risks due to aseptic loosening, infection, stiffness, patella tracking problems, thromboembolic complications and other imponderables were discussed. The patient acknowledged the explanation, agreed to proceed with the plan and consent was signed. Patient is being admitted for inpatient treatment for surgery, pain control, PT, OT, prophylactic antibiotics, VTE prophylaxis, progressive ambulation and ADLs and discharge planning. The patient is planning to be discharged home.   Patient's  anticipated LOS is less than 2 midnights, meeting these requirements: - Younger than 64 - Lives within 1 hour of care - Has a competent adult at home to recover with post-op recover - NO history of  - Chronic pain requiring opiods  - Diabetes  - Coronary Artery Disease  - Heart failure  - Heart attack  - Stroke  - DVT/VTE  - Cardiac arrhythmia  - Respiratory Failure/COPD  - Renal failure  - Anemia  -  Advanced Liver disease    Rosina Calin, PA-C Orthopedic Surgery [facility] [place] [phone]      [1] No Known Allergies

## 2024-01-28 NOTE — Interval H&P Note (Signed)
 History and Physical Interval Note:  01/28/2024 6:51 AM  Shane JONETTA Como, MD  has presented today for surgery, with the diagnosis of Right knee osteoarthritis.  The various methods of treatment have been discussed with the patient and family. After consideration of risks, benefits and other options for treatment, the patient has consented to  Procedures: ARTHROPLASTY, KNEE, TOTAL (Right) as a surgical intervention.  The patient's history has been reviewed, patient examined, no change in status, stable for surgery.  I have reviewed the patient's chart and labs.  Questions were answered to the patient's satisfaction.     [name]

## 2024-01-28 NOTE — Anesthesia Procedure Notes (Signed)
 Spinal  Start time: 01/28/2024 7:19 AM End time: 01/28/2024 7:22 AM Reason for block: surgical anesthesia  Staffing Performed: anesthesiologist  Authorized by: Tilford Franky BIRCH, MD   Performed by: Tilford Franky BIRCH, MD  Preanesthetic Checklist Completed: patient identified, IV checked, site marked, risks and benefits discussed, surgical consent, monitors and equipment checked, pre-op evaluation and timeout performed Spinal Block Patient position: sitting Prep: DuraPrep and site prepped and draped Location: L3-4 Injection technique: single-shot Needle Needle type: Pencan  Needle gauge: 24 G Needle length: 10 cm Needle insertion depth (cm): 10  Additional Notes Patient tolerated well. No immediate complications.  Functioning IV was confirmed and monitors were applied. Sterile prep and drape, including hand hygiene and sterile gloves were used. The patient was positioned and the back was prepped. The skin was anesthetized with lidocaine . Free flow of clear CSF was obtained prior to injecting local anesthetic into the CSF. The spinal needle aspirated freely following injection. The needle was carefully withdrawn. The patient tolerated the procedure well.

## 2024-01-28 NOTE — Discharge Instructions (Signed)

## 2024-01-29 ENCOUNTER — Encounter (HOSPITAL_COMMUNITY): Payer: Self-pay | Admitting: Orthopedic Surgery

## 2024-02-11 ENCOUNTER — Other Ambulatory Visit (HOSPITAL_COMMUNITY): Payer: Self-pay

## 2024-02-11 MED ORDER — PREGABALIN 75 MG PO CAPS
75.0000 mg | ORAL_CAPSULE | Freq: Every day | ORAL | 0 refills | Status: AC
  Filled 2024-02-11: qty 30, 30d supply, fill #0

## 2024-02-12 ENCOUNTER — Other Ambulatory Visit (HOSPITAL_COMMUNITY): Payer: Self-pay
